# Patient Record
Sex: Female | Born: 1960 | Race: White | Hispanic: No | Smoking: Current some day smoker
Health system: Southern US, Community
[De-identification: ages and names within clinical notes are randomized; demographics above are authoritative.]

## PROBLEM LIST (undated history)

## (undated) DIAGNOSIS — K621 Rectal polyp: Secondary | ICD-10-CM

## (undated) DIAGNOSIS — Z87891 Personal history of nicotine dependence: Secondary | ICD-10-CM

## (undated) DIAGNOSIS — K5790 Diverticulosis of intestine, part unspecified, without perforation or abscess without bleeding: Secondary | ICD-10-CM

## (undated) DIAGNOSIS — M5412 Radiculopathy, cervical region: Secondary | ICD-10-CM

## (undated) DIAGNOSIS — K645 Perianal venous thrombosis: Secondary | ICD-10-CM

## (undated) DIAGNOSIS — Z8619 Personal history of other infectious and parasitic diseases: Secondary | ICD-10-CM

## (undated) DIAGNOSIS — O009 Unspecified ectopic pregnancy without intrauterine pregnancy: Secondary | ICD-10-CM

## (undated) DIAGNOSIS — I671 Cerebral aneurysm, nonruptured: Secondary | ICD-10-CM

## (undated) DIAGNOSIS — R911 Solitary pulmonary nodule: Secondary | ICD-10-CM

## (undated) DIAGNOSIS — Z9289 Personal history of other medical treatment: Secondary | ICD-10-CM

## (undated) HISTORY — DX: Radiculopathy, cervical region: M54.12

## (undated) HISTORY — PX: TUBAL LIGATION: SHX77

## (undated) HISTORY — DX: Diverticulosis of intestine, part unspecified, without perforation or abscess without bleeding: K57.90

## (undated) HISTORY — DX: Unspecified ectopic pregnancy without intrauterine pregnancy: O00.90

## (undated) HISTORY — DX: Cerebral aneurysm, nonruptured: I67.1

## (undated) HISTORY — DX: Rectal polyp: K62.1

## (undated) HISTORY — DX: Personal history of other infectious and parasitic diseases: Z86.19

## (undated) HISTORY — DX: Solitary pulmonary nodule: R91.1

## (undated) HISTORY — DX: Personal history of nicotine dependence: Z87.891

## (undated) HISTORY — PX: HUMERUS FRACTURE SURGERY: SHX670

## (undated) HISTORY — DX: Personal history of other medical treatment: Z92.89

## (undated) HISTORY — DX: Perianal venous thrombosis: K64.5

## (undated) HISTORY — PX: SALPINGOOPHORECTOMY: SHX82

---
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
## 1978-12-23 DIAGNOSIS — O009 Unspecified ectopic pregnancy without intrauterine pregnancy: Secondary | ICD-10-CM

## 1978-12-23 HISTORY — DX: Unspecified ectopic pregnancy without intrauterine pregnancy: O00.90

## 2010-04-23 DIAGNOSIS — I671 Cerebral aneurysm, nonruptured: Secondary | ICD-10-CM

## 2010-04-23 HISTORY — PX: ANEURYSM COILING: SHX5349

## 2010-04-23 HISTORY — DX: Cerebral aneurysm, nonruptured: I67.1

## 2010-06-08 ENCOUNTER — Ambulatory Visit (INDEPENDENT_AMBULATORY_CARE_PROVIDER_SITE_OTHER): Payer: 59 | Admitting: Internal Medicine

## 2010-06-08 ENCOUNTER — Encounter: Payer: Self-pay | Admitting: Internal Medicine

## 2010-06-08 DIAGNOSIS — K649 Unspecified hemorrhoids: Secondary | ICD-10-CM

## 2010-06-14 NOTE — Assessment & Plan Note (Signed)
Summary: HEMOROIDS/EVM   Vital Signs:  Patient Profile:   50 Years Old Female CC:      hemmorrhoid pain for 1 day Height:     66 inches Weight:      116 pounds Temp:     98.6 degrees F Pulse rate:   90 / minute Resp:     16 per minute BP sitting:   122 / 72  (left arm)  Pt. in pain?   yes    Intensity:   8    Type:       sharp                   Current Allergies: No known allergies History of Present Illness Chief Complaint: hemorrhoid pain for 1 day History of Present Illness: hemorrhoids for years but has never needed medical care. Usually responds to over the counter creams. This time the pain is worse and harder to reduce. No n/v/d. stools are brown and the only blood is when she's wiping after bm. 8/10 sharp pain hasn't been helped by advil.  REVIEW OF SYSTEMS Constitutional Symptoms      Denies fever, chills, night sweats, weight loss, weight gain, and fatigue.  Eyes       Denies change in vision, eye pain, eye discharge, glasses, contact lenses, and eye surgery. Ear/Nose/Throat/Mouth       Denies hearing loss/aids, change in hearing, ear pain, ear discharge, dizziness, frequent runny nose, frequent nose bleeds, sinus problems, sore throat, hoarseness, and tooth pain or bleeding.  Respiratory       Denies dry cough, productive cough, wheezing, shortness of breath, asthma, bronchitis, and emphysema/COPD.  Cardiovascular       Denies murmurs, chest pain, and tires easily with exhertion.    Gastrointestinal       Denies stomach pain, nausea/vomiting, diarrhea, constipation, blood in bowel movements, and indigestion.      Comments: see hpi Genitourniary       Denies painful urination, kidney stones, and loss of urinary control. Neurological       Denies paralysis, seizures, and fainting/blackouts. Musculoskeletal       Denies muscle pain, joint pain, joint stiffness, decreased range of motion, redness, swelling, muscle weakness, and gout.  Skin       Denies  bruising, unusual mles/lumps or sores, and hair/skin or nail changes.  Psych       Denies mood changes, temper/anger issues, anxiety/stress, speech problems, depression, and sleep problems.  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  Past Medical History: denies  Past Surgical History: Tubal ligation lost one ovary after ectopic traumatic repairs right arm  Family History: healthy  Social History: smokes 1 ppd, drinks 5 beers/week no drugs homemaker Physical Exam General appearance: thin, well developed, well nourished, no acute distress Eyes: conjunctivae and lids normal Neck: neck supple,  Abdomen: soft, non-tender without obvious organomegaly GU: 1x2 cm external hemorrhoid, sl firm, very tender that reduces manually Extremities: trauma scars right arm Neurological: grossly intact and non-focal Skin: no obvious rashes MSE: oriented to time, place, and person Assessment New Problems: HEMORRHOIDS (ICD-455.6)   Plan New Medications/Changes: HYDROCODONE-ACETAMINOPHEN 5-500 MG TABS (HYDROCODONE-ACETAMINOPHEN) 1 by mouth four times daily as needed pain  #15 x 0, 06/08/2010, J. Juline Patch MD ANUSOL-HC 25 MG SUPP (HYDROCORTISONE ACETATE) insert 1 per rectum two times a day or three times a day for 2-6  days  #10 x 1, 06/08/2010, J. Juline Patch MD   The patient and/or caregiver has been counseled thoroughly with regard to medications prescribed including dosage, schedule, interactions, rationale for use, and possible side effects and they verbalize understanding.  Diagnoses and expected course of recovery discussed and will return if not improved as expected or if the condition worsens. Patient and/or caregiver verbalized understanding.  Prescriptions: HYDROCODONE-ACETAMINOPHEN 5-500 MG TABS (HYDROCODONE-ACETAMINOPHEN) 1 by mouth four times daily as  needed pain  #15 x 0   Entered and Authorized by:   J. Juline Patch MD   Signed by:   Shela Commons. Juline Patch MD on 06/08/2010   Method used:   Print then Give to Patient   RxID:   [id number] ANUSOL-HC 25 MG SUPP (HYDROCORTISONE ACETATE) insert 1 per rectum two times a day or three times a day for 2-6 days  #10 x 1   Entered and Authorized by:   J. Juline Patch MD   Signed by:   Shela Commons. Juline Patch MD on 06/08/2010   Method used:   Electronically to        [facility]* (retail)       [address]       [place], [place]  [address]       Ph: [phone]       Fax: [phone]   RxID:   [id number]   Patient Instructions: 1)  sits baths. 2)  donut pillows. 3)  avoid straining your stomach muscles. 4)  go to emergency if you lose the ability to reduce the hemorrhoid. 5)  consider consulting general surgeon for permanent removal. 6)  Take 400-600mg  of Ibuprofen (Advil, Motrin) with food every 4-6 hours as needed for relief of pain or comfort of fever.

## 2010-06-15 ENCOUNTER — Ambulatory Visit (INDEPENDENT_AMBULATORY_CARE_PROVIDER_SITE_OTHER): Payer: 59 | Admitting: Family Medicine

## 2010-06-15 ENCOUNTER — Encounter: Payer: Self-pay | Admitting: Family Medicine

## 2010-06-15 DIAGNOSIS — Z87891 Personal history of nicotine dependence: Secondary | ICD-10-CM | POA: Insufficient documentation

## 2010-06-15 DIAGNOSIS — K645 Perianal venous thrombosis: Secondary | ICD-10-CM

## 2010-06-15 DIAGNOSIS — F17200 Nicotine dependence, unspecified, uncomplicated: Secondary | ICD-10-CM | POA: Insufficient documentation

## 2010-06-16 ENCOUNTER — Encounter (INDEPENDENT_AMBULATORY_CARE_PROVIDER_SITE_OTHER): Payer: Self-pay | Admitting: *Deleted

## 2010-06-16 ENCOUNTER — Other Ambulatory Visit: Payer: Self-pay | Admitting: Family Medicine

## 2010-06-16 ENCOUNTER — Other Ambulatory Visit (INDEPENDENT_AMBULATORY_CARE_PROVIDER_SITE_OTHER): Payer: 59

## 2010-06-16 DIAGNOSIS — Z0000 Encounter for general adult medical examination without abnormal findings: Secondary | ICD-10-CM

## 2010-06-16 LAB — LIPID PANEL
HDL: 50.5 mg/dL (ref >39.00)
LDL Cholesterol: 88 mg/dL (ref 0–99)
Total CHOL/HDL Ratio: 3
Triglycerides: 104 mg/dL (ref 0.0–149.0)
VLDL: 20.8 mg/dL (ref 0.0–40.0)

## 2010-06-16 LAB — CBC WITH DIFFERENTIAL/PLATELET
Basophils Relative: 0.6 % (ref 0.0–3.0)
Eosinophils Relative: 3.1 % (ref 0.0–5.0)
Lymphocytes Relative: 30.9 % (ref 12.0–46.0)
MCV: 95.4 fl (ref 78.0–100.0)
Monocytes Absolute: 0.4 10*3/uL (ref 0.1–1.0)
Neutrophils Relative %: 60.2 % (ref 43.0–77.0)
RBC: 3.99 Mil/uL (ref 3.87–5.11)
WBC: 8.3 10*3/uL (ref 4.5–10.5)

## 2010-06-16 LAB — BASIC METABOLIC PANEL
CO2: 30 mEq/L (ref 19–32)
Calcium: 9.3 mg/dL (ref 8.4–10.5)
Creatinine, Ser: 0.6 mg/dL (ref 0.4–1.2)

## 2010-06-16 LAB — HEPATIC FUNCTION PANEL
Bilirubin, Direct: 0.1 mg/dL (ref 0.0–0.3)
Total Protein: 6.5 g/dL (ref 6.0–8.3)

## 2010-06-20 NOTE — Assessment & Plan Note (Deleted)
Summary: Gina Powell PATIENT/[employer]/GAVE HUSBAND NPP /   Vital Signs:  Patient profile:   50 year old female Height:      66 inches Weight:      119.75 pounds BMI:     19.40 Temp:     97.7 degrees F oral Pulse rate:   76 / minute Pulse rhythm:   regular BP sitting:   136 / 78  (left arm) Cuff size:   regular  Vitals Entered By: Selena Batten Dance CMA Duncan Dull) (June 15, 2010 10:35 AM) CC: new pt establish   History of Present Illness: CC: new patient, establish  hemorrhoid issue - went last week to [facility], given suppository and pain med.  got worse, went over weekend to [place] and lanced.  Cream given.  Seen again yesterday at [facility], told looking better.  Today better controlled, pain bearable with 1/2 vicodin.  H/o hemorrhoids in past, never as bad as this.  Bowels pretty regular, 2-3 soft stools a day.    otherwise no concerns.  may have picked up cold from doctor's office.  preventative: last CPE 1980s, had pregnancy where had to be hospitalized (ruptured ectopic). never had doctor. last pap 1980s unsure last blood work. last tetanus shot - 10/2009 flu shot - 2 years ago.  -  Date:  10/21/2009    TD booster given  Current Medications (verified): 1)  Flax Seed Oil 1000 Mg Caps (Flaxseed (Linseed)) .Marland Kitchen.. 1 By Mouth Three Times A Day 2)  Garlic Oil 1000 Mg Caps (Garlic) .Marland Kitchen.. 1 By Mouth Once Daily 3)  Hydrocodone-Acetaminophen 5-500 Mg Tabs (Hydrocodone-Acetaminophen) .Marland Kitchen.. 1 By Mouth Every 4-6 Hours As Needed For Pain (Hemorrhoid Pain) 4)  Proctozone-Hc 2.5 % Crea (Hydrocortisone) .... Apply 2-3 Times Daily To Hemorrhoid 5)  Hydrocortisone Acetate 25 Mg Supp (Hydrocortisone Acetate) .... Insert 1 Rectally 2-3 Times Daily For Hemorrhoid  Allergies (verified): No Known Drug Allergies  Past History:  Past Medical History: hemorrhoid smoking h/o blood transfusion  ~1978 h/o chicken pox  Past Surgical History: T&A 1972 ruptured ectopic x2 1980s, 1995, s/p BTL and one ovary/tube  removed (unsure)  Family History: Father: unsure Mother: HLD, DM, HTN PGM: leukemia PGF: colon CA PAunt: bone/blood cancer MGF: CAD/MI MGM: CAD/MI MGAunt: Breast cancer  No cancers, CVA  Social History: 1/2 ppd, 1 beer/day, no rec drugs caffeine: decaf coffee Lives with husband Alden Server), outside cats Occupation: stay at home, 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         per HPI  Physical Exam  General:  Well-developed,well-nourished,in no acute distress; alert,appropriate and cooperative throughout examination.   Head:  Normocephalic and atraumatic without obvious abnormalities. No apparent alopecia or balding. Eyes:  No corneal or conjunctival inflammation noted. EOMI. Perrla.  Ears:  Tms clear bilaterally Nose:  nares clear bilaterally Mouth:  MMM, no pharyngeal erythema Neck:  No deformities, masses, or tenderness noted.  no LAD, no thyromegaly Lungs:  Normal respiratory effort, chest expands symmetrically. Lungs are clear to auscultation, no crackles or wheezes. Heart:  Normal rate and regular rhythm. S1 and S2 normal without gallop, murmur, click, rub or other extra sounds. Abdomen:  Bowel sounds positive,abdomen soft and non-tender without masses, organomegaly or hernias noted. Rectal:  large  ~3cm R swollen prolapsed external hemorrhoid.  Msk:  No deformity or scoliosis noted of thoracic or lumbar spine.   Pulses:  2+ rad pulses Extremities:  no pedal edema Neurologic:  CN grossly intact, station and gait intact Skin:  Intact without suspicious lesions or rashes Psych:  full affect, pleasant and cooperative   Impression &  Recommendations:  Problem # 1:  HEMORRHOID, THROMBOSED (ICD-455.7) thrombosed external hemorrhoid.  currently continues inflammed.  over weekend lanced, small amt clots expressed per pt at [facility].  started using hydrocortisone yesterday, swelling slowly resolving.  if continued, not resolved, may need referral to surg for definitive treatment/banding.  discussed diet to resolve hemorrhoids (more water, more fiber) and goal of 1 soft stool/day.  Problem # 2:  TOBACCO ABUSE (ICD-305.1) encouraged cessation.  Complete Medication List: 1)  Flax Seed Oil 1000 Mg Caps (Flaxseed (linseed)) .Marland Kitchen.. 1 by mouth three times a day 2)  Garlic Oil 1000 Mg Caps (Garlic) .Marland Kitchen.. 1 by mouth once daily 3)  Hydrocodone-acetaminophen 5-500 Mg Tabs (Hydrocodone-acetaminophen) .Marland Kitchen.. 1 by mouth every 4-6 hours as needed for pain (hemorrhoid pain) 4)  Proctozone-hc 2.5 % Crea (Hydrocortisone) .... Apply 2-3 times daily to hemorrhoid 5)  Hydrocortisone Acetate 25 Mg Supp (Hydrocortisone acetate) .... Insert 1 rectally 2-3 times daily for hemorrhoid  Patient Instructions: 1)  Return at your convenience in next few weeks for complete physical (set up with mammogram, breast exam, pap smear). 2)  return fasting for blood work in the next few weeks [FLP, CMP, TSH, CBC V70.0]. 3)  Good to meet you today. 4)  Continue current treatment for hemorrhoid.  If worsening or not improving as expected call me to set you up with surgeon.   Orders Added: 1)  New Patient Level III [[address]]    Current Allergies (reviewed today): No known allergies

## 2010-06-27 ENCOUNTER — Encounter (INDEPENDENT_AMBULATORY_CARE_PROVIDER_SITE_OTHER): Payer: Self-pay | Admitting: *Deleted

## 2010-06-27 ENCOUNTER — Encounter: Payer: Self-pay | Admitting: Family Medicine

## 2010-06-27 ENCOUNTER — Other Ambulatory Visit: Payer: Self-pay | Admitting: Family Medicine

## 2010-06-27 ENCOUNTER — Encounter (INDEPENDENT_AMBULATORY_CARE_PROVIDER_SITE_OTHER): Payer: 59 | Admitting: Family Medicine

## 2010-06-27 ENCOUNTER — Other Ambulatory Visit (HOSPITAL_COMMUNITY)
Admission: RE | Admit: 2010-06-27 | Discharge: 2010-06-27 | Disposition: A | Discharge status: Home / Self Care | Payer: 59 | Source: Ambulatory Visit | Attending: Family Medicine | Admitting: Family Medicine

## 2010-06-27 DIAGNOSIS — Z1159 Encounter for screening for other viral diseases: Secondary | ICD-10-CM | POA: Insufficient documentation

## 2010-06-27 DIAGNOSIS — E876 Hypokalemia: Secondary | ICD-10-CM

## 2010-06-27 DIAGNOSIS — K645 Perianal venous thrombosis: Secondary | ICD-10-CM

## 2010-06-27 DIAGNOSIS — Z0000 Encounter for general adult medical examination without abnormal findings: Secondary | ICD-10-CM

## 2010-06-27 DIAGNOSIS — Z1231 Encounter for screening mammogram for malignant neoplasm of breast: Secondary | ICD-10-CM

## 2010-06-27 DIAGNOSIS — Z124 Encounter for screening for malignant neoplasm of cervix: Secondary | ICD-10-CM | POA: Insufficient documentation

## 2010-06-27 DIAGNOSIS — F17200 Nicotine dependence, unspecified, uncomplicated: Secondary | ICD-10-CM

## 2010-06-27 LAB — CONVERTED CEMR LAB: Pap Smear: NORMAL

## 2010-07-03 ENCOUNTER — Encounter (INDEPENDENT_AMBULATORY_CARE_PROVIDER_SITE_OTHER): Payer: Self-pay | Admitting: *Deleted

## 2010-07-03 ENCOUNTER — Encounter: Payer: Self-pay | Admitting: *Deleted

## 2010-07-04 NOTE — Assessment & Plan Note (Deleted)
Summary: CPX SET UP MAMOGRAM BREAST EXAM PAP/[facility]   Vital Signs:  Patient profile:   50 year old female Weight:      121.50 pounds Temp:     98.3 degrees F oral Pulse rate:   76 / minute Pulse rhythm:   regular BP sitting:   116 / 70  (left arm) Cuff size:   regular  Vitals Entered By: Selena Batten Dance CMA Duncan Dull) (June 27, 2010 8:36 AM) CC: CPX   History of Present Illness: CC: CPE  hemorrhoid - not tender anymore but still present.  no bleeding.  normal stools.  no pain with stools.  still using hydrocortisone cream.  due for pap (last 1988, abnormal then went back to normal), mammo (never had). discussed colon cancer screening.  has never had.  requests colonoscopy.  smoking -  ~ 3/4 ppd .  states trying to quit.  wt stable around 110-120 lbs normally.  no loss.  no blood in stool.  Current Medications (verified): 1)  Flax Seed Oil 1000 Mg Caps (Flaxseed (Linseed)) .Marland Kitchen.. 1 By Mouth Three Times A Day 2)  Garlic Oil 1000 Mg Caps (Garlic) .Marland Kitchen.. 1 By Mouth Once Daily 3)  Proctozone-Hc 2.5 % Crea (Hydrocortisone) .... 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    Physical Exam  General:  Well-developed,well-nourished,in no acute distress; alert,appropriate and cooperative throughout examination.   Head:  Normocephalic and atraumatic without obvious abnormalities. No apparent alopecia or balding. Eyes:  No corneal or conjunctival inflammation noted. EOMI. Perrla.  Ears:  Tms clear bilaterally Nose:  nares clear bilaterally Mouth:  MMM, no pharyngeal erythema Neck:  No deformities, masses, or tenderness noted.  no LAD, no thyromegaly Breasts:  No mass, nodules, thickening, tenderness, bulging, retraction, inflamation, nipple discharge or skin changes noted.   Lungs:  Normal respiratory effort, chest expands symmetrically. Lungs are clear to auscultation, no crackles or wheezes. Heart:  Normal rate and regular rhythm. S1 and S2 normal without gallop, murmur, click, rub or other extra sounds. Abdomen:  Bowel sounds positive,abdomen soft and non-tender without masses, organomegaly or hernias noted. Rectal:  large  ~3cm R prolapsed hemorrhoid.  not reducible.  ?rectal prolapse Genitalia:  Pelvic Exam:        External: normal female genitalia without lesions or masses        Vagina: normal without lesions or masses        Cervix: normal without lesions or masses        Adnexa: normal bimanual exam without masses or fullness        Uterus: normal by palpation        Pap smear: performed Msk:  No deformity or scoliosis noted of thoracic or lumbar spine.   Pulses:  2+ rad pulses Extremities:  no pedal edema Neurologic:  CN grossly intact, station and gait intact Skin:  Intact without suspicious lesions or rashes Psych:  full affect, pleasant and cooperative   Impression & Recommendations:  Problem # 1:  HEALTH MAINTENANCE EXAM (ICD-V70.0) utd tetanus.   await surgery eval prior to setting up with colonsocopy but pt prefers colonoscopy over stool kit. set up  mammogram.  breast exam reassuring today. pap smear today.  pelvic reassuring. provided with copy of blood work.  Problem # 2:  HYPOKALEMIA (ICD-276.8)  recheck K  Orders: Venipuncture ([address]) TLB-Potassium (K+) (84132-K)  Problem # 3:  OTHER SCREENING MAMMOGRAM (ICD-V76.12) mammo ordered.   Orders: Radiology Referral (Radiology)  Problem # 4:  SCREENING FOR MALIGNANT NEOPLASM OF THE CERVIX (ICD-V76.2) pap today.  h/o abnormal, prior to 1988, none since.  rec rpt yearly x 3 years, then q3 years.  Orders: Pap Smear, Thin Prep ( Collection of) (U0454)  Problem # 5:  TOBACCO ABUSE (ICD-305.1)  encouraged cessation.  Problem # 6:  HEMORRHOID, THROMBOSED (ICD-455.7) referral to surgery for evaluation  will likely need removal.  ? mild rectal prolapse  Orders: Surgical Referral (Surgery)  Complete Medication List: 1)  Flax Seed Oil 1000 Mg Caps (Flaxseed (linseed)) .Marland Kitchen.. 1 by mouth three times a day 2)  Garlic Oil 1000 Mg Caps (Garlic) .Marland Kitchen.. 1 by mouth once daily 3)  Proctozone-hc 2.5 % Crea (Hydrocortisone) .... Apply 2-3 times daily to hemorrhoid  Patient Instructions: 1)  Return in 1 year or as needed, sooner if you want help with quitting smoking. 2)  pap smear today and breast exam today.  blood work provided. 3)  we will recheck potassium level today - increase amount of fruits and vegetables. 4)  pass by [facility] office for surgery referral as well as mammogram appointment. 5)  Good to see you today, call us with questions.   Orders Added: 1)  Venipuncture [36415] 2)  TLB-Potassium (K+) [84132-K] 3)  Est. Patient 40-64 years [99396] 4)  Pap Smear, Thin Prep ( Collection of) [Q0091] 5)  Surgical Referral [Surgery] 6)  Radiology Referral [Radiology]    Current Allergies (reviewed today): No known allergies

## 2010-07-07 ENCOUNTER — Encounter (HOSPITAL_COMMUNITY): Payer: Self-pay | Admitting: Radiology

## 2010-07-07 ENCOUNTER — Telehealth: Payer: Self-pay | Admitting: Family Medicine

## 2010-07-07 ENCOUNTER — Emergency Department (HOSPITAL_COMMUNITY): Payer: 59

## 2010-07-07 ENCOUNTER — Emergency Department (HOSPITAL_COMMUNITY)
Admission: EM | Admit: 2010-07-07 | Discharge: 2010-07-07 | Disposition: A | Discharge status: Nursing Home (Includes ICF) | Payer: 59 | Attending: Emergency Medicine | Admitting: Emergency Medicine

## 2010-07-07 DIAGNOSIS — I609 Nontraumatic subarachnoid hemorrhage, unspecified: Secondary | ICD-10-CM | POA: Insufficient documentation

## 2010-07-07 DIAGNOSIS — H53149 Visual discomfort, unspecified: Secondary | ICD-10-CM | POA: Insufficient documentation

## 2010-07-07 DIAGNOSIS — R112 Nausea with vomiting, unspecified: Secondary | ICD-10-CM | POA: Insufficient documentation

## 2010-07-07 DIAGNOSIS — R51 Headache: Secondary | ICD-10-CM | POA: Insufficient documentation

## 2010-07-07 DIAGNOSIS — M2569 Stiffness of other specified joint, not elsewhere classified: Secondary | ICD-10-CM | POA: Insufficient documentation

## 2010-07-07 LAB — DIFFERENTIAL
Basophils Absolute: 0 10*3/uL (ref 0.0–0.1)
Basophils Relative: 0 % (ref 0–1)
Eosinophils Absolute: 0.2 10*3/uL (ref 0.0–0.7)
Eosinophils Relative: 2 % (ref 0–5)
Lymphocytes Relative: 20 % (ref 12–46)
Monocytes Absolute: 0.7 10*3/uL (ref 0.1–1.0)

## 2010-07-07 LAB — BASIC METABOLIC PANEL
BUN: 8 mg/dL (ref 6–23)
CO2: 26 mEq/L (ref 19–32)
Chloride: 112 mEq/L (ref 96–112)
Creatinine, Ser: 0.61 mg/dL (ref 0.4–1.2)
Glucose, Bld: 112 mg/dL — ABNORMAL HIGH (ref 70–99)

## 2010-07-07 LAB — CBC
HCT: 40.4 % (ref 36.0–46.0)
MCHC: 34.7 g/dL (ref 30.0–36.0)
RDW: 14.3 % (ref 11.5–15.5)

## 2010-07-07 LAB — PROTIME-INR: INR: 1.06 (ref 0.00–1.49)

## 2010-07-09 NOTE — Consult Note (Signed)
  Gina Powell, Gina Powell              ACCOUNT NO.:  [id number]  MEDICAL RECORD NO.:  [id number]           PATIENT TYPE:  E  LOCATION:  [facility]                         FACILITY:  [facility]  PHYSICIAN:  Hilda Lias, M.D.   DATE OF BIRTH:  [birth year]  DATE OF CONSULTATION:  07/07/2010 DATE OF DISCHARGE:  07/07/2010                                CONSULTATION   Ms. Steptoe is a lady who was brought to Emergency Room by her husband complaining of sudden onset of headache.  The patient has never had a headache before.  Clinically, she had been doing really well until now. At the emergency room, she had a CT scan and we were called for evaluation.  PHYSICAL EXAMINATION:  VITAL SIGNS:  Blood pressure 106/60, pulse of 60, respiratory rate of 18.  She is afebrile. GENERAL:  She has no history of any allergy.  Mentally, she is oriented x3.  She is complaining mostly of headache and photophobia. HEAD, EARS, NOSE, AND THROAT:  Normal. NECK:  She has mild stiffness of the neck. LUNGS:  Clear. HEART:  Normal. ABDOMEN:  Normal. EXTREMITIES:  Normal pulse. NEURO:  Oriented x3.  Cranial nerves are normal.  It was difficult to do the funduscopy secondary to the patient having photophobia.  Face is symmetrical.  Sensation normal.  Reflexes symmetrical.  CT scan shows diffuse subarachnoid hemorrhage.  I talked to the patient and to the husband.  Immediately after that, I called Dr. Delia Chimes, [facility] who [facility] surgeon.  I explained the situation to him and the patient transferred.  He is going to made all the arrangement as I mentioned.  The patient will be transferred as soon as possible.  After I finished talking to Dr. Delia Chimes, I talked to the patient herself as well as the husband.  Today the patient is going to be transferred to [facility], they will do the angiogram and based on the result they will to either endovascular procedure or surgery.  The question from the hospital  were answered.          ______________________________ Hilda Lias, M.D.     EB/MEDQ  D:  07/07/2010  T:  07/08/2010  Job:  [id number]  Electronically Signed by Hilda Lias M.D. on 07/09/2010 12:13:15 AM

## 2010-07-11 NOTE — Miscellaneous (Deleted)
Summary: PAP to flowsheet  Clinical Lists Changes  Observations: Added new observation of PAP DUE: 06/2011 (07/03/2010 9:13) Added new observation of PAP SMEAR: normal (06/27/2010 9:14)      Preventive Care Screening  Pap Smear:    Date:  06/27/2010    Next Due:  06/2011    Results:  normal 

## 2010-07-11 NOTE — Letter (Deleted)
Summary: Results Follow up Letter  [place] [facility]  [address] [place], [place] [address]   Phone: [phone]  Fax: [phone]    07/03/2010 MRN: [id number]  [facility] [address] [place], [place]  [address]  [place]  Dear Ms. Schult,  The following are the results of your recent test(s):  Test         Result    Pap Smear:        Normal __X___  Not Normal _____ Comments: Repeat in 1 year for 2 more years then can wait 3 years. ______________________________________________________ Cholesterol: LDL(Bad cholesterol):         Your goal is less than:         HDL (Good cholesterol):       Your goal is more than: Comments:  ______________________________________________________ Mammogram:        Normal _____  Not Normal _____ Comments:  ___________________________________________________________________ Hemoccult:        Normal _____  Not normal _______ Comments:    _____________________________________________________________________ Other Tests:    We routinely do not discuss normal results over the telephone.  If you desire a copy of the results, or you have any questions about this information we can discuss them at your next office visit.   Sincerely,        Dr. Eustaquio Boyden

## 2010-07-12 ENCOUNTER — Ambulatory Visit: Payer: 59

## 2010-07-20 NOTE — Letter (Signed)
Summary: Results Follow up Letter  [name] [place]  [address]   [place], [place] [address]   Phone: [phone]  Fax: [phone]    07/03/2010 MRN: [id number]  Gina Powell [address] [place], [place]  [address]  [place]  Dear [name],  The following are the results of your recent test(s):  Test         Result    Pap Smear:        Normal __X___  Not Normal _____ Comments: Repeat in 1 year for 2 more years then can wait 3 years. ______________________________________________________ Cholesterol: LDL(Bad cholesterol):         Your goal is less than:         HDL (Good cholesterol):       Your goal is more than: Comments:  ______________________________________________________ Mammogram:        Normal _____  Not Normal _____ Comments:  ___________________________________________________________________ Hemoccult:        Normal _____  Not normal _______ Comments:    _____________________________________________________________________ Other Tests:    We routinely do not discuss normal results over the telephone.  If you desire a copy of the results, or you have any questions about this information we can discuss them at your next office visit.   Sincerely,        Dr. Javier Gutierrez 

## 2010-07-20 NOTE — Miscellaneous (Signed)
Summary: PAP to flowsheet  Clinical Lists Changes  Observations: Added new observation of PAP DUE: 06/2011 (07/03/2010 9:13) Added new observation of PAP SMEAR: normal (06/27/2010 9:14)      Preventive Care Screening  Pap Smear:    Date:  06/27/2010    Next Due:  06/2011    Results:  normal

## 2010-07-20 NOTE — Assessment & Plan Note (Signed)
Summary: /RBHNEW PATIENT/[facility]/GAVE HUSBAND NPP /   Vital Signs:  Patient profile:   49 year old female Height:      66 inches Weight:      119.75 pounds BMI:     19.40 Temp:     97.7 degrees F oral Pulse rate:   76 / minute Pulse rhythm:   regular BP sitting:   136 / 78  (left arm) Cuff size:   regular  Vitals Entered By: Kim Dance CMA (AAMA) (June 15, 2010 10:35 AM) CC: new pt establish   History of Present Illness: CC: new patient, establish  hemorrhoid issue - went last week to [facility], given suppository and pain med.  got worse, went over weekend to [facility] and lanced.  Cream given.  Seen again yesterday at [facility], told looking better.  Today better controlled, pain bearable with 1/2 vicodin.  H/o hemorrhoids in past, never as bad as this.  Bowels pretty regular, 2-3 soft stools a day.    otherwise no concerns.  may have picked up cold from doctor's office.  preventative: last CPE 1980s, had pregnancy where had to be hospitalized (ruptured ectopic). never had doctor. last pap 1980s unsure last blood work. last tetanus shot - 10/2009 flu shot - 2 years ago.  -  Date:  10/21/2009    TD booster given  Current Medications (verified): 1)  Flax Seed Oil 1000 Mg Caps (Flaxseed (Linseed)) .... 1 By Mouth Three Times A Day 2)  Garlic Oil 1000 Mg Caps (Garlic) .... 1 By Mouth Once Daily 3)  Hydrocodone-Acetaminophen 5-500 Mg Tabs (Hydrocodone-Acetaminophen) .... 1 By Mouth Every 4-6 Hours As Needed For Pain (Hemorrhoid Pain) 4)  Proctozone-Hc 2.5 % Crea (Hydrocortisone) .... Apply 2-3 Times Daily To Hemorrhoid 5)  Hydrocortisone Acetate 25 Mg Supp (Hydrocortisone Acetate) .... Insert 1 Rectally 2-3 Times Daily For Hemorrhoid  Allergies (verified): No Known Drug Allergies  Past History:  Past Medical History: hemorrhoid smoking h/o blood transfusion  ~1978 h/o chicken pox  Past Surgical History: T&A 1972 ruptured ectopic x2 1980s, 1995, s/p BTL and one ovary/tube  removed (unsure)  Family History: Father: unsure Mother: HLD, DM, HTN PGM: leukemia PGF: colon CA PAunt: bone/blood cancer MGF: CAD/MI MGM: CAD/MI MGAunt: Breast cancer  No cancers, CVA  Social History: 1/2 ppd, 1 beer/day, no rec drugs caffeine: decaf coffee Lives with husband (Ernest), outside cats Occupation: stay at home, 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         per HPI  Physical Exam  General:  Well-developed,well-nourished,in no acute distress; alert,appropriate and cooperative throughout examination.   Head:  Normocephalic and atraumatic without obvious abnormalities. No apparent alopecia or balding. Eyes:  No corneal or conjunctival inflammation noted. EOMI. Perrla.  Ears:  Tms clear bilaterally Nose:  nares clear bilaterally Mouth:  MMM, no pharyngeal erythema Neck:  No deformities, masses, or tenderness noted.  no LAD, no thyromegaly Lungs:  Normal respiratory effort, chest expands symmetrically. Lungs are clear to auscultation, no crackles or wheezes. Heart:  Normal rate and regular rhythm. S1 and S2 normal without gallop, murmur, click, rub or other extra sounds. Abdomen:  Bowel sounds positive,abdomen soft and non-tender without masses, organomegaly or hernias noted. Rectal:  large  ~3cm R swollen prolapsed external hemorrhoid.   Msk:  No deformity or scoliosis noted of thoracic or lumbar spine.   Pulses:  2+ rad pulses Extremities:  no pedal edema Neurologic:  CN grossly intact, station and gait intact Skin:  Intact without suspicious lesions or rashes Psych:  full affect, pleasant and cooperative   Impression &  Recommendations:  Problem # 1:  HEMORRHOID, THROMBOSED (ICD-455.7) thrombosed external hemorrhoid.  currently continues inflammed.  over weekend lanced, small amt clots expressed per pt at [facility].  started using hydrocortisone yesterday, swelling slowly resolving.  if continued, not resolved, may need referral to surg for definitive treatment/banding.  discussed diet to resolve hemorrhoids (more water, more fiber) and goal of 1 soft stool/day.  Problem # 2:  TOBACCO ABUSE (ICD-305.1) encouraged cessation.  Complete Medication List: 1)  Flax Seed Oil 1000 Mg Caps (Flaxseed (linseed)) .... 1 by mouth three times a day 2)  Garlic Oil 1000 Mg Caps (Garlic) .... 1 by mouth once daily 3)  Hydrocodone-acetaminophen 5-500 Mg Tabs (Hydrocodone-acetaminophen) .... 1 by mouth every 4-6 hours as needed for pain (hemorrhoid pain) 4)  Proctozone-hc 2.5 % Crea (Hydrocortisone) .... Apply 2-3 times daily to hemorrhoid 5)  Hydrocortisone Acetate 25 Mg Supp (Hydrocortisone acetate) .... Insert 1 rectally 2-3 times daily for hemorrhoid  Patient Instructions: 1)  Return at your convenience in next few weeks for complete physical (set up with mammogram, breast exam, pap smear). 2)  return fasting for blood work in the next few weeks [FLP, CMP, TSH, CBC V70.0]. 3)  Good to meet you today. 4)  Continue current treatment for hemorrhoid.  If worsening or not improving as expected call me to set you up with surgeon.   Orders Added: 1)  New Patient Level III [99203]    Current Allergies (reviewed today): No known allergies  

## 2010-07-20 NOTE — Assessment & Plan Note (Signed)
Summary: CPX SET UP MAMOGRAM BREAST EXAM PAP/[facility]   Vital Signs:  Patient profile:   50 year old female Weight:      121.50 pounds Temp:     98.3 degrees F oral Pulse rate:   76 / minute Pulse rhythm:   regular BP sitting:   116 / 70  (left arm) Cuff size:   regular  Vitals Entered By: Kim Dance CMA (AAMA) (June 27, 2010 8:36 AM) CC: CPX   History of Present Illness: CC: CPE  hemorrhoid - not tender anymore but still present.  no bleeding.  normal stools.  no pain with stools.  still using hydrocortisone cream.  due for pap (last 1988, abnormal then went back to normal), mammo (never had). discussed colon cancer screening.  has never had.  requests colonoscopy.  smoking -  ~ 3/4 ppd .  states trying to quit.  wt stable around 110-120 lbs normally.  no loss.  no blood in stool.  Current Medications (verified): 1)  Flax Seed Oil 1000 Mg Caps (Flaxseed (Linseed)) .... 1 By Mouth Three Times A Day 2)  Garlic Oil 1000 Mg Caps (Garlic) .... 1 By Mouth Once Daily 3)  Proctozone-Hc 2.5 % Crea (Hydrocortisone) .... 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    Physical Exam  General:  Well-developed,well-nourished,in no acute distress; alert,appropriate and cooperative throughout examination.   Head:  Normocephalic and atraumatic without obvious abnormalities. No apparent alopecia or balding. Eyes:  No corneal or conjunctival inflammation noted. EOMI. Perrla.  Ears:  Tms clear bilaterally Nose:  nares clear bilaterally Mouth:  MMM, no pharyngeal erythema Neck:  No deformities, masses, or tenderness noted.  no LAD, no thyromegaly Breasts:  No mass, nodules, thickening, tenderness, bulging, retraction, inflamation, nipple discharge or skin changes noted.   Lungs:  Normal respiratory effort, chest expands symmetrically. Lungs are clear to auscultation, no crackles or wheezes. Heart:  Normal rate and regular rhythm. S1 and S2 normal without gallop, murmur, click, rub or other extra sounds. Abdomen:  Bowel sounds positive,abdomen soft and non-tender without masses, organomegaly or hernias noted. Rectal:  large  ~3cm R prolapsed hemorrhoid.  not reducible.  ?rectal prolapse Genitalia:  Pelvic Exam:        External: normal female genitalia without lesions or masses        Vagina: normal without lesions or masses        Cervix: normal without lesions or masses        Adnexa: normal bimanual exam without masses or fullness        Uterus: normal by palpation        Pap smear: performed Msk:  No deformity or scoliosis noted of thoracic or lumbar spine.   Pulses:    2+ rad pulses Extremities:  no pedal edema Neurologic:  CN grossly intact, station and gait intact Skin:  Intact without suspicious lesions or rashes Psych:  full affect, pleasant and cooperative   Impression & Recommendations:  Problem # 1:  HEALTH MAINTENANCE EXAM (ICD-V70.0) utd tetanus.   await surgery eval prior to setting up with colonsocopy but pt prefers colonoscopy over stool kit. set up  mammogram.  breast exam reassuring today. pap smear today.  pelvic reassuring. provided with copy of blood work.  Problem # 2:  HYPOKALEMIA (ICD-276.8)  recheck K  Orders: Venipuncture (36415) TLB-Potassium (K+) (84132-K)  Problem # 3:  OTHER SCREENING MAMMOGRAM (ICD-V76.12) mammo ordered.   Orders: Radiology Referral (Radiology)  Problem # 4:  SCREENING FOR MALIGNANT NEOPLASM OF THE CERVIX (ICD-V76.2) pap today.  h/o abnormal, prior to 1988, none since.  rec rpt yearly x 3 years, then q3 years.  Orders: Pap Smear, Thin Prep ( Collection of) (Q0091)  Problem # 5:  TOBACCO ABUSE (ICD-305.1)  encouraged cessation.  Problem # 6:  HEMORRHOID, THROMBOSED (ICD-455.7) referral to surgery for evaluation  will likely need removal.  ? mild rectal prolapse  Orders: Surgical Referral (Surgery)  Complete Medication List: 1)  Flax Seed Oil 1000 Mg Caps (Flaxseed (linseed)) .... 1 by mouth three times a day 2)  Garlic Oil 1000 Mg Caps (Garlic) .... 1 by mouth once daily 3)  Proctozone-hc 2.5 % Crea (Hydrocortisone) .... Apply 2-3 times daily to hemorrhoid  Patient Instructions: 1)  Return in 1 year or as needed, sooner if you want help with quitting smoking. 2)  pap smear today and breast exam today.  blood work provided. 3)  we will recheck potassium level today - increase amount of fruits and vegetables. 4)  pass by [facility] for surgery referral as well as mammogram appointment. 5)  Good to see you today, call us with questions.   Orders Added: 1)  Venipuncture [36415] 2)  TLB-Potassium (K+) [84132-K] 3)  Est. Patient 40-64 years [99396] 4)  Pap Smear, Thin Prep ( Collection of) [Q0091] 5)  Surgical Referral [Surgery] 6)  Radiology Referral [Radiology]    Current Allergies (reviewed today): No known allergies  

## 2010-07-20 NOTE — Letter (Deleted)
Summary: Results Follow up Letter  [facility] [place]  [address]   [place], [place] [address]   Phone: [phone]  Fax: [phone]    07/03/2010 MRN: [id number]  Gina Powell [address] [place], [place]  [address]  [place]  Dear Ms. Weideman,  The following are the results of your recent test(s):  Test         Result    Pap Smear:        Normal __X___  Not Normal _____ Comments: Repeat in 1 year for 2 more years then can wait 3 years. ______________________________________________________ Cholesterol: LDL(Bad cholesterol):         Your goal is less than:         HDL (Good cholesterol):       Your goal is more than: Comments:  ______________________________________________________ Mammogram:        Normal _____  Not Normal _____ Comments:  ___________________________________________________________________ Hemoccult:        Normal _____  Not normal _______ Comments:    _____________________________________________________________________ Other Tests:    We routinely do not discuss normal results over the telephone.  If you desire a copy of the results, or you have any questions about this information we can discuss them at your next office visit.   Sincerely,        Dr. Javier Gutierrez 

## 2010-07-20 NOTE — Progress Notes (Signed)
Summary: aneurysm/seizure  Phone Note Call from Patient Call back at [phone]   Caller: Bill-husband Call For: Dr.Aveline Daus Summary of Call: Pt's husband called.  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  She's @ [facility] and is going in for brain surgery. Initial call taken by: Beau Fanny,  July 07, 2010 2:17 PM  Follow-up for Phone Call        called to touch base.  currently in surgery at [facility].  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  she is alert currently.  advised we would call next week to see how things went.  plz call Monday for fu. Follow-up by: Eustaquio Boyden  MD,  July 07, 2010 6:35 PM  Additional Follow-up for Phone Call Additional follow up Details #1::        Spoke with patient's husband. He said the surgery went very well and that the patient is finally able to eat today and keep things down. She is in the ICU currently with no word yet on when she will be transferred to step-down or discharged. She is alert and oriented and remembers the whole ordeal except for the actual seizure. She is currently having CT's every 12 hours to make sure things are okay. He said she is doing great considering the circumstances and appreciates [place] calling and checking on them.  Additional Follow-up by: Janee Morn CMA Duncan Dull),  July 10, 2010 11:29 AM    Additional Follow-up for Phone Call Additional follow up Details #2::    noted. thanks. Follow-up by: Eustaquio Boyden  MD,  July 10, 2010 1:29 PM

## 2010-07-24 ENCOUNTER — Telehealth: Payer: Self-pay | Admitting: *Deleted

## 2010-07-24 NOTE — Telephone Encounter (Signed)
Opened in error

## 2010-07-31 ENCOUNTER — Encounter: Payer: Self-pay | Admitting: Family Medicine

## 2010-08-01 ENCOUNTER — Encounter: Payer: Self-pay | Admitting: Family Medicine

## 2010-08-01 ENCOUNTER — Ambulatory Visit (INDEPENDENT_AMBULATORY_CARE_PROVIDER_SITE_OTHER): Payer: 59 | Admitting: Family Medicine

## 2010-08-01 VITALS — BP 110/80 | HR 76 | Temp 98.3°F | Wt 123.0 lb

## 2010-08-01 DIAGNOSIS — F17200 Nicotine dependence, unspecified, uncomplicated: Secondary | ICD-10-CM

## 2010-08-01 DIAGNOSIS — K645 Perianal venous thrombosis: Secondary | ICD-10-CM

## 2010-08-01 DIAGNOSIS — I671 Cerebral aneurysm, nonruptured: Secondary | ICD-10-CM | POA: Insufficient documentation

## 2010-08-01 NOTE — Assessment & Plan Note (Signed)
Continues prolapsed.  Awaiting surgery referral for eval.

## 2010-08-01 NOTE — Assessment & Plan Note (Signed)
Reviewed recent records from [facility].  To f/u with NSG in 4 1/2 wks.  Advised to ask duration of bid ASA therapy. 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

## 2010-08-01 NOTE — Assessment & Plan Note (Signed)
Quit smoking!  Congratulated and encouraged abstinence.

## 2010-08-01 NOTE — Patient Instructions (Signed)
Congratulations on quitting smoking!  Let us know if you think you need any help with the smoking. Good to see you today.  Call us with questions. Keep follow up appointment for next physical, return sooner if needed.

## 2010-08-01 NOTE — Progress Notes (Signed)
  Subjective:    Patient ID: Gina Powell, female    DOB: 01-Sep-1960, 50 y.o.   MRN: [id number]  HPI CC: f/u recent hospitalization  Presents with mother.  Reviewed [facility] records.  Hospitalization from 3/16-3/31/2012 for [facility] from ruptured basilar aneurysm s/p emergent coiling (Dr. Kelby Aline).  Seizure activity with acute initial HA, none since, not d/c with anti-epileptic drug.  Occasional headaches, states to be expected for next several weeks.  No vision changes.  Appetite ok.  To see NSG for f/u in 4 1/2 wks.  Finished Nimodipine course (21 days).  Only risk factor was smoking but states quit smoking!! since hospitalized.  Thinks will do well with this as hasn't really had cravings.  Hemorrhoid, decreasing in size, had to cancel surg f/u 2/2 recent events, will reschedule.  Also cancelled mammography, colonoscopy.  States with reschedule when things settle down.  Medications and allergies reviewed and updated as above. PMHx, SurgHx, SHx and FMHx reviewed for relevance and updated in chart.  Review of Systems Per HPI    Objective:   Physical Exam  Vitals reviewed. Constitutional: She is oriented to person, place, and time. She appears well-developed and well-nourished. No distress.  HENT:  Head: Normocephalic and atraumatic.  Mouth/Throat: Oropharynx is clear and moist. No oropharyngeal exudate.  Eyes: Conjunctivae and EOM are normal. Pupils are equal, round, and reactive to light. No scleral icterus.  Neck: Normal range of motion. Neck supple. No thyromegaly present.  Cardiovascular: Normal rate, regular rhythm, normal heart sounds and intact distal pulses.   No murmur heard. Pulmonary/Chest: Effort normal and breath sounds normal. No respiratory distress. She has no wheezes. She has no rales.  Genitourinary:       ~2.5cm R prolapsed hemorrhoid.  not reducible.  Lymphadenopathy:    She has no cervical adenopathy.  Neurological: She is alert and oriented to person, place, and  time. She has normal reflexes. No cranial nerve deficit. She exhibits normal muscle tone. Coordination normal.  Skin: Skin is warm and dry. No rash noted.  Psychiatric: She has a normal mood and affect.          Assessment & Plan:

## 2010-08-02 ENCOUNTER — Ambulatory Visit: Payer: 59 | Admitting: Family Medicine

## 2010-08-07 ENCOUNTER — Other Ambulatory Visit: Payer: Self-pay | Admitting: Family Medicine

## 2010-08-07 MED ORDER — BUTALBITAL-APAP-CAFFEINE 50-325-40 MG PO TABS: 1.0000 | ORAL_TABLET | ORAL | Status: DC | PRN

## 2010-08-07 NOTE — Progress Notes (Signed)
rec pt take tylenol, if not controlling HA, may take fioricet (per NSG expect to have HA for 6 mo after surgery).

## 2010-09-10 ENCOUNTER — Encounter: Payer: Self-pay | Admitting: Family Medicine

## 2010-10-26 ENCOUNTER — Encounter (HOSPITAL_COMMUNITY): Payer: Self-pay | Admitting: Radiology

## 2011-06-21 ENCOUNTER — Other Ambulatory Visit: Payer: Self-pay | Admitting: Family Medicine

## 2011-06-21 DIAGNOSIS — Z0000 Encounter for general adult medical examination without abnormal findings: Secondary | ICD-10-CM

## 2011-06-21 DIAGNOSIS — E876 Hypokalemia: Secondary | ICD-10-CM

## 2011-06-21 DIAGNOSIS — I671 Cerebral aneurysm, nonruptured: Secondary | ICD-10-CM

## 2011-06-25 ENCOUNTER — Other Ambulatory Visit (INDEPENDENT_AMBULATORY_CARE_PROVIDER_SITE_OTHER): Payer: 59

## 2011-06-25 DIAGNOSIS — I671 Cerebral aneurysm, nonruptured: Secondary | ICD-10-CM

## 2011-06-25 DIAGNOSIS — Z0000 Encounter for general adult medical examination without abnormal findings: Secondary | ICD-10-CM

## 2011-06-25 LAB — CBC WITH DIFFERENTIAL/PLATELET
Basophils Absolute: 0 10*3/uL (ref 0.0–0.1)
Basophils Relative: 0.6 % (ref 0.0–3.0)
Eosinophils Absolute: 0.2 10*3/uL (ref 0.0–0.7)
Hemoglobin: 12.6 g/dL (ref 12.0–15.0)
Lymphocytes Relative: 37.3 % (ref 12.0–46.0)
MCHC: 33.9 g/dL (ref 30.0–36.0)
MCV: 89.9 fl (ref 78.0–100.0)
Monocytes Absolute: 0.4 10*3/uL (ref 0.1–1.0)
Neutro Abs: 2.9 10*3/uL (ref 1.4–7.7)
RDW: 15.4 % — ABNORMAL HIGH (ref 11.5–14.6)

## 2011-06-25 LAB — COMPREHENSIVE METABOLIC PANEL
ALT: 23 U/L (ref 0–35)
AST: 21 U/L (ref 0–37)
CO2: 28 mEq/L (ref 19–32)
Calcium: 9.3 mg/dL (ref 8.4–10.5)
Chloride: 111 mEq/L (ref 96–112)
GFR: 91.81 mL/min (ref >60.00)
Sodium: 144 mEq/L (ref 135–145)
Total Bilirubin: 0.2 mg/dL — ABNORMAL LOW (ref 0.3–1.2)
Total Protein: 6.5 g/dL (ref 6.0–8.3)

## 2011-06-25 LAB — LIPID PANEL
Total CHOL/HDL Ratio: 3
VLDL: 17.6 mg/dL (ref 0.0–40.0)

## 2011-06-28 ENCOUNTER — Ambulatory Visit (INDEPENDENT_AMBULATORY_CARE_PROVIDER_SITE_OTHER)
Admission: RE | Admit: 2011-06-28 | Discharge: 2011-06-28 | Disposition: A | Discharge status: Home / Self Care | Payer: BC Managed Care – PPO | Source: Ambulatory Visit | Attending: Family Medicine | Admitting: Family Medicine

## 2011-06-28 ENCOUNTER — Other Ambulatory Visit (HOSPITAL_COMMUNITY)
Admission: RE | Admit: 2011-06-28 | Discharge: 2011-06-28 | Disposition: A | Discharge status: Home / Self Care | Payer: BC Managed Care – PPO | Source: Ambulatory Visit | Attending: Family Medicine | Admitting: Family Medicine

## 2011-06-28 ENCOUNTER — Ambulatory Visit (INDEPENDENT_AMBULATORY_CARE_PROVIDER_SITE_OTHER): Payer: Self-pay | Admitting: Family Medicine

## 2011-06-28 ENCOUNTER — Encounter: Payer: Self-pay | Admitting: Internal Medicine

## 2011-06-28 ENCOUNTER — Encounter: Payer: Self-pay | Admitting: Family Medicine

## 2011-06-28 DIAGNOSIS — R911 Solitary pulmonary nodule: Secondary | ICD-10-CM | POA: Insufficient documentation

## 2011-06-28 DIAGNOSIS — Z1211 Encounter for screening for malignant neoplasm of colon: Secondary | ICD-10-CM

## 2011-06-28 DIAGNOSIS — J438 Other emphysema: Secondary | ICD-10-CM

## 2011-06-28 DIAGNOSIS — Z87891 Personal history of nicotine dependence: Secondary | ICD-10-CM

## 2011-06-28 DIAGNOSIS — Z01419 Encounter for gynecological examination (general) (routine) without abnormal findings: Secondary | ICD-10-CM | POA: Insufficient documentation

## 2011-06-28 DIAGNOSIS — Z0000 Encounter for general adult medical examination without abnormal findings: Secondary | ICD-10-CM

## 2011-06-28 DIAGNOSIS — Z1159 Encounter for screening for other viral diseases: Secondary | ICD-10-CM | POA: Insufficient documentation

## 2011-06-28 DIAGNOSIS — Z1231 Encounter for screening mammogram for malignant neoplasm of breast: Secondary | ICD-10-CM

## 2011-06-28 MED ORDER — SERTRALINE HCL 25 MG PO TABS: 25.0000 mg | ORAL_TABLET | Freq: Every day | ORAL | Status: DC

## 2011-06-28 NOTE — Assessment & Plan Note (Addendum)
In ex smoker Rpt cxr today.

## 2011-06-28 NOTE — Patient Instructions (Signed)
Start zoloft for hot flashes.  Start at 25mg  daily for 1 week then may go up to 50 mg daily.  Don't stop suddenly, taper off if you want to stop.  Let me know how this medicine does. Pass by [facility] office to schedule mammogram and colonoscopy. Xray today to check on lungs. Good to see you today, call us with quesitons.

## 2011-06-28 NOTE — Assessment & Plan Note (Signed)
Preventative protocols reviewed and updated unless pt declined. Blood work reviewed in detail. refer for colonoscopy and mammogram.

## 2011-06-28 NOTE — Assessment & Plan Note (Signed)
Stays smoke free.

## 2011-06-28 NOTE — Progress Notes (Signed)
Addended by: Josph Macho A on: 06/28/2011 09:31 AM   Modules accepted: Orders

## 2011-06-28 NOTE — Progress Notes (Signed)
Subjective:    Patient ID: Gina Powell, female    DOB: 1960-12-05, 51 y.o.   MRN: [id number]  HPI CC: CPE  menopause around age 62yo.  Bad hot flashes.  Takes black cohosh which helps some but still really bothersome.  Occur every day, about 3-4/day.  No issues vaginal dryness.  Interested in trying SSRI for this.    No NS, fevers/chills.  Wt up 40 lbs.  Quit smoking, started eating. Smoking - quit 07/07/2011.  Wt Readings from Last 3 Encounters:  06/28/11 162 lb 4 oz (73.596 kg)  08/01/10 123 lb 0.6 oz (55.811 kg)  06/27/10 121 lb 8 oz (55.112 kg)   pulm nodule - ?6mm left lateral base, rec rpt CXR.  Will obtain today.  Preventative: Pap smear -due today.  Abnormal one remotely - normal 2012 Due for mammogram.  Never had last year, requests we set up. Colon cancer - requests colonoscopy. Tetanus - 10/2009 Flu shot last year. Seat belt 100% time  Caffeine: decaf coffee Lives with husband Alden Server), outside cats Occupation: Stay at [employer] service Edu: 11th grade Activity: planning on joining gym Diet: Good water, fruits/vegetables daily, avoids red meat  Medications and allergies reviewed and updated in chart.  Past histories reviewed and updated if relevant as below. Patient Active Problem List  Diagnoses  . HYPOKALEMIA  . TOBACCO ABUSE  . HEMORRHOID, THROMBOSED  . TOBACCO ABUSE  . HEMORRHOID, THROMBOSED  . Aneurysm, cerebral, nonruptured   Past Medical History  Diagnosis Date  . Unspecified thrombosed hemorrhoids   . History of tobacco abuse   . History of chicken pox   . History of transfusion of whole blood ~1978  . Ruptured ectopic pregnancy 1980's    x2  . 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 Emphysema     ?biapical emphysema found on CTA neck, consider rpt CXR  . Pulmonary nodule     ?6mm left lateral base, rec rpt CXR   Past  Surgical History  Procedure Date  . Tonsillectomy and adenoidectomy 1972  . Tubal ligation   . Salpingoophorectomy     one ovary and tube  . Aneurysm coiling 2012    ruptured basilar aneurysm  . Humerus fracture surgery     R plate in humerus   History  Substance Use Topics  . Smoking status: Former Smoker -- 0.5 packs/day    Quit date: 07/07/2010  . Smokeless tobacco: Never Used  . Alcohol Use: No     Quit-07-07-2010   Family History  Problem Relation Age of Onset  . Hyperlipidemia Mother   . Diabetes Mother   . Hypertension Mother   . Leukemia Paternal Grandmother   . Colon cancer Paternal Grandfather     90 y/o  . Bone cancer Paternal Aunt     bone and blood cancer  . Coronary artery disease Maternal Grandfather   . Coronary artery disease Maternal Grandmother   . Breast cancer      MGAunt  . Aneurysm Neg Hx    No Known Allergies Current Outpatient Prescriptions on File Prior to Visit  Medication Sig Dispense Refill  . Flaxseed, Linseed, (FLAX SEED OIL) 1000 MG CAPS Take 1 capsule by mouth 3 (three) times daily.        . Garlic Oil 1000 MG CAPS Take 1 capsule by mouth daily.        . hydrocortisone (  ANUSOL-HC) 2.5 % rectal cream Place 1 application rectally 2 (two) times daily.           Review of Systems  Constitutional: Negative for fever, chills, activity change, appetite change, fatigue and unexpected weight change.  HENT: Negative for hearing loss and neck pain.   Eyes: Negative for visual disturbance.  Respiratory: Negative for cough, chest tightness, shortness of breath and wheezing.   Cardiovascular: Negative for chest pain, palpitations and leg swelling.  Gastrointestinal: Negative for nausea, vomiting, abdominal pain, diarrhea, constipation, blood in stool and abdominal distention.  Genitourinary: Negative for hematuria and difficulty urinating.  Musculoskeletal: Negative for myalgias and arthralgias.  Skin: Negative for rash.  Neurological: Positive  for headaches. Negative for dizziness, seizures and syncope.  Hematological: Does not bruise/bleed easily.  Psychiatric/Behavioral: Negative for dysphoric mood. The patient is not nervous/anxious.        Objective:   Physical Exam  Nursing note and vitals reviewed. Constitutional: She is oriented to person, place, and time. She appears well-developed and well-nourished. No distress.  HENT:  Head: Normocephalic and atraumatic.  Right Ear: External ear normal.  Left Ear: External ear normal.  Nose: Nose normal.  Mouth/Throat: Oropharynx is clear and moist. No oropharyngeal exudate.  Eyes: Conjunctivae and EOM are normal. Pupils are equal, round, and reactive to light. No scleral icterus.  Neck: Normal range of motion. Neck supple. No thyromegaly present.  Cardiovascular: Normal rate, regular rhythm, normal heart sounds and intact distal pulses.   No murmur heard. Pulses:      Radial pulses are 2+ on the right side, and 2+ on the left side.  Pulmonary/Chest: Effort normal and breath sounds normal. No respiratory distress. She has no wheezes. She has no rales. Right breast exhibits no inverted nipple, no mass, no nipple discharge, no skin change and no tenderness. Left breast exhibits no inverted nipple, no mass, no nipple discharge, no skin change and no tenderness. Breasts are symmetrical.  Abdominal: Soft. Bowel sounds are normal. She exhibits no distension and no mass. There is no tenderness. There is no rebound and no guarding.  Genitourinary: Vagina normal and uterus normal. Rectal exam shows internal hemorrhoid. Pelvic exam was performed with patient supine. There is no rash, tenderness or lesion on the right labia. There is no rash, tenderness or lesion on the left labia. Cervix exhibits no motion tenderness, no discharge and no friability. Right adnexum displays no mass, no tenderness and no fullness. Left adnexum displays no mass, no tenderness and no fullness. No erythema or tenderness  around the vagina. No foreign body around the vagina. No signs of injury around the vagina. No vaginal discharge found.       Pap performed hemorrhoid present but not currently inflamed or bleeding  Musculoskeletal: Normal range of motion.  Lymphadenopathy:    She has no cervical adenopathy.    She has no axillary adenopathy.       Right axillary: No lateral adenopathy present.       Left axillary: No lateral adenopathy present.      Right: No supraclavicular adenopathy present.       Left: No supraclavicular adenopathy present.  Neurological: She is alert and oriented to person, place, and time.       CN grossly intact, station and gait intact  Skin: Skin is warm and dry. No rash noted.  Psychiatric: She has a normal mood and affect. Her behavior is normal. Judgment and thought content normal.      Assessment &  Plan:

## 2011-07-03 ENCOUNTER — Ambulatory Visit
Admission: RE | Admit: 2011-07-03 | Discharge: 2011-07-03 | Disposition: A | Discharge status: Home / Self Care | Payer: BC Managed Care – PPO | Source: Ambulatory Visit | Attending: Family Medicine | Admitting: Family Medicine

## 2011-07-03 DIAGNOSIS — Z1231 Encounter for screening mammogram for malignant neoplasm of breast: Secondary | ICD-10-CM

## 2011-07-04 ENCOUNTER — Other Ambulatory Visit: Payer: Self-pay | Admitting: Family Medicine

## 2011-07-04 DIAGNOSIS — R928 Other abnormal and inconclusive findings on diagnostic imaging of breast: Secondary | ICD-10-CM

## 2011-07-05 ENCOUNTER — Encounter: Payer: Self-pay | Admitting: *Deleted

## 2011-07-11 ENCOUNTER — Encounter: Payer: Self-pay | Admitting: *Deleted

## 2011-07-11 ENCOUNTER — Ambulatory Visit
Admission: RE | Admit: 2011-07-11 | Discharge: 2011-07-11 | Disposition: A | Discharge status: Home / Self Care | Payer: BC Managed Care – PPO | Source: Ambulatory Visit | Attending: Family Medicine | Admitting: Family Medicine

## 2011-07-11 DIAGNOSIS — R928 Other abnormal and inconclusive findings on diagnostic imaging of breast: Secondary | ICD-10-CM

## 2011-07-17 ENCOUNTER — Ambulatory Visit (AMBULATORY_SURGERY_CENTER): Payer: BC Managed Care – PPO

## 2011-07-17 ENCOUNTER — Encounter: Payer: Self-pay | Admitting: Internal Medicine

## 2011-07-17 VITALS — Ht 66.5 in | Wt 164.4 lb

## 2011-07-17 DIAGNOSIS — Z800 Family history of malignant neoplasm of digestive organs: Secondary | ICD-10-CM

## 2011-07-17 DIAGNOSIS — Z1211 Encounter for screening for malignant neoplasm of colon: Secondary | ICD-10-CM

## 2011-07-17 MED ORDER — PEG-KCL-NACL-NASULF-NA ASC-C 100 G PO SOLR: 1.0000 | Freq: Once | ORAL | Status: AC

## 2011-07-23 DIAGNOSIS — K621 Rectal polyp: Secondary | ICD-10-CM

## 2011-07-23 HISTORY — PX: COLONOSCOPY: SHX174

## 2011-07-23 HISTORY — DX: Rectal polyp: K62.1

## 2011-07-31 ENCOUNTER — Encounter: Payer: Self-pay | Admitting: Internal Medicine

## 2011-07-31 ENCOUNTER — Ambulatory Visit (AMBULATORY_SURGERY_CENTER): Payer: BC Managed Care – PPO | Admitting: Internal Medicine

## 2011-07-31 VITALS — BP 129/54 | HR 66 | Temp 97.7°F | Resp 18 | Ht 66.5 in | Wt 164.0 lb

## 2011-07-31 DIAGNOSIS — Z1211 Encounter for screening for malignant neoplasm of colon: Secondary | ICD-10-CM

## 2011-07-31 DIAGNOSIS — D126 Benign neoplasm of colon, unspecified: Secondary | ICD-10-CM

## 2011-07-31 DIAGNOSIS — Z800 Family history of malignant neoplasm of digestive organs: Secondary | ICD-10-CM

## 2011-07-31 MED ORDER — SODIUM CHLORIDE 0.9 % IV SOLN: 500.0000 mL | INTRAVENOUS | Status: DC

## 2011-07-31 NOTE — Op Note (Signed)
[facility] [address] [place], [place]  [address]  COLONOSCOPY PROCEDURE REPORT  PATIENT:  Gina Powell, Gina Powell  MR#:  [id number] BIRTHDATE:  May 10, 1960, 50 yrs. old  GENDER:  female ENDOSCOPIST:  Carie Caddy. Lyndia Bury, MD REF. BY:  Eustaquio Boyden, M.D. PROCEDURE DATE:  07/31/2011 PROCEDURE:  Colonoscopy with snare polypectomy ASA CLASS:  Class II INDICATIONS:  Routine Risk Screening MEDICATIONS:   MAC sedation, administered by CRNA, propofol (Diprivan) 300 mg IV  DESCRIPTION OF PROCEDURE:   After the risks benefits and alternatives of the procedure were thoroughly explained, informed consent was obtained.  Digital rectal exam was performed and revealed no rectal masses.   The LB 180AL [id number] endoscope was introduced through the anus and advanced to the terminal ileum which was intubated for a short distance, without limitations. The quality of the prep was good, using MoviPrep.  The instrument was then slowly withdrawn as the colon was fully examined. <<PROCEDUREIMAGES>>  FINDINGS:  The terminal ileum appeared normal.  Five sessile polyps measuring 3 - 5 mm were found in the rectum. Polyps were snared without cautery. Retrieval was successful. Scattered diverticulosis was found ascending colon to sigmoid colon. Medium-size internal hemorrhoids were found.   Retroflexed views in the rectum revealed no other findings other than those already described.  The scope was then withdrawn from the cecum and the procedure completed.  COMPLICATIONS:  None ENDOSCOPIC IMPRESSION: 1) Normal terminal ileum 2) Five polyps in the rectum. Removed and sent to pathology. 3) Diverticulosis, scattered ascending colon to sigmoid colon 4) Internal hemorrhoids  RECOMMENDATIONS: 1) Await pathology results 2) High fiber diet. 3) If the polyps removed today are proven to be adenomatous (pre-cancerous) polyps, you will need a colonoscopy in 3 years. Otherwise you should continue to follow  colorectal cancer screening guidelines for "routine risk" patients with a colonoscopy in 10 years. You will receive a letter within 1-2 weeks with the results of your biopsy as well as final recommendations. Please call my office if you have not received a letter after 3 weeks.  Carie Caddy. Rhea Belton, MD  CC:  Eustaquio Boyden MD The Patient  n. eSIGNED:   Carie Caddy. Donal Lynam at 07/31/2011 10:24 AM  Ralph Leyden, [id number]

## 2011-07-31 NOTE — Progress Notes (Signed)
Patient did not experience any of the following events: a burn prior to discharge; a fall within the facility; wrong site/side/patient/procedure/implant event; or a hospital transfer or hospital admission upon discharge from the facility. (G8907) Patient did not have preoperative order for IV antibiotic SSI prophylaxis. (G8918)  

## 2011-07-31 NOTE — Patient Instructions (Signed)

## 2011-08-01 ENCOUNTER — Telehealth: Payer: Self-pay | Admitting: *Deleted

## 2011-08-01 NOTE — Telephone Encounter (Signed)
  Follow up Call-  Call back number 07/31/2011  Post procedure Call Back phone  # [phone]  Permission to leave phone message Yes     Patient questions:  Do you have a fever, pain , or abdominal swelling? no Pain Score  0 *  Have you tolerated food without any problems? yes  Have you been able to return to your normal activities? yes  Do you have any questions about your discharge instructions: Diet   no Medications  no Follow up visit  no  Do you have questions or concerns about your Care? no  Actions: * If pain score is 4 or above: No action needed, pain <4.

## 2011-08-02 ENCOUNTER — Other Ambulatory Visit: Payer: Self-pay

## 2011-08-02 MED ORDER — SERTRALINE HCL 100 MG PO TABS: 50.0000 mg | ORAL_TABLET | Freq: Every day | ORAL | Status: DC

## 2011-08-02 NOTE — Telephone Encounter (Signed)
Spoke with patient and advised as instructed via telephone, she stated that she rather have the 100mg  tablets.  Advised her that Rx was sent to her pharmacy.

## 2011-08-02 NOTE — Telephone Encounter (Signed)
Pt said she will refill her Sertaline today and wants changed to 100 mg so pt can take 1/2 tab daily.Please advise. Pt last seen 06/28/11. Pt uses [facility]. Pt can be reached [phone].

## 2011-08-02 NOTE — Telephone Encounter (Signed)
Ok to do - sent in 100mg  dose, enough for 3 mo at a time.  plz notify pt and ensure she doesn't want 50mg  dose (so doesn't have to cut in 1/2.)

## 2011-08-07 ENCOUNTER — Encounter: Payer: Self-pay | Admitting: Internal Medicine

## 2011-08-08 ENCOUNTER — Encounter: Payer: Self-pay | Admitting: Family Medicine

## 2012-04-23 HISTORY — PX: ANTERIOR CERVICAL DECOMP/DISCECTOMY FUSION: SHX1161

## 2012-04-29 ENCOUNTER — Other Ambulatory Visit: Payer: Self-pay | Admitting: Family Medicine

## 2012-04-29 MED ORDER — ASPIRIN EC 325 MG PO TBEC: 325.0000 mg | DELAYED_RELEASE_TABLET | Freq: Every day | ORAL | Status: AC

## 2012-05-28 ENCOUNTER — Encounter: Payer: Self-pay | Admitting: Family Medicine

## 2012-06-21 DIAGNOSIS — M5412 Radiculopathy, cervical region: Secondary | ICD-10-CM

## 2012-06-21 HISTORY — DX: Radiculopathy, cervical region: M54.12

## 2012-07-29 ENCOUNTER — Encounter: Payer: Self-pay | Admitting: Family Medicine

## 2012-08-13 ENCOUNTER — Encounter: Payer: Self-pay | Admitting: Family Medicine

## 2012-09-03 ENCOUNTER — Other Ambulatory Visit: Payer: Self-pay | Admitting: Family Medicine

## 2012-09-10 ENCOUNTER — Encounter: Payer: Self-pay | Admitting: Family Medicine

## 2012-09-10 ENCOUNTER — Ambulatory Visit (INDEPENDENT_AMBULATORY_CARE_PROVIDER_SITE_OTHER): Payer: BC Managed Care – PPO | Admitting: Family Medicine

## 2012-09-10 VITALS — BP 128/88 | HR 72 | Temp 98.1°F | Wt 130.5 lb

## 2012-09-10 DIAGNOSIS — F4323 Adjustment disorder with mixed anxiety and depressed mood: Secondary | ICD-10-CM

## 2012-09-10 DIAGNOSIS — M7020 Olecranon bursitis, unspecified elbow: Secondary | ICD-10-CM

## 2012-09-10 DIAGNOSIS — M7022 Olecranon bursitis, left elbow: Secondary | ICD-10-CM | POA: Insufficient documentation

## 2012-09-10 DIAGNOSIS — M7021 Olecranon bursitis, right elbow: Secondary | ICD-10-CM

## 2012-09-10 MED ORDER — LORAZEPAM 0.5 MG PO TABS: 0.2500 mg | ORAL_TABLET | Freq: Two times a day (BID) | ORAL | Status: DC | PRN

## 2012-09-10 MED ORDER — CITALOPRAM HYDROBROMIDE 10 MG PO TABS: 10.0000 mg | ORAL_TABLET | Freq: Every day | ORAL | Status: DC

## 2012-09-10 MED ORDER — SERTRALINE HCL 100 MG PO TABS: 100.0000 mg | ORAL_TABLET | Freq: Every day | ORAL | Status: DC

## 2012-09-10 NOTE — Assessment & Plan Note (Signed)
After husband left her. Discussed support system at home. Denies SI/HI. Currently on zoloft - will increase to 100mg  daily - and add temporary benzo prn anxiety. Pt agrees with plan. Return in 1 mo for f/u.

## 2012-09-10 NOTE — Assessment & Plan Note (Signed)
Traumatic R olecranon bursitis. Discussed reasons to aspirate as well as complications, recurrence and possible need for surgery. Aspirated today and pt tolerated well. Discussed after care. To return if rpt accumulation of fluid, consider steroid injection.

## 2012-09-10 NOTE — Patient Instructions (Signed)
Increase zoloft to 100mg  daily. May use ativan as needed for anxiety. Olecranon bursa drained today - keep compression bandage on for 1-2 days. If fever, redness or warmth or worse pain, please return to see me sooner Otherwise follow up in 1 month .  Olecranon Bursitis Bursitis is swelling and soreness (inflammation) of a fluid-filled sac (bursa) that covers and protects a joint. Olecranon bursitis occurs over the elbow.  CAUSES Bursitis can be caused by injury, overuse of the joint, arthritis, or infection.  SYMPTOMS   Tenderness, swelling, warmth, or redness over the elbow.  Elbow pain with movement. This is greater with bending the elbow.  Squeaking sound when the bursa is rubbed or moved.  Increasing size of the bursa without pain or discomfort.  Fever with increasing pain and swelling if the bursa becomes infected. HOME CARE INSTRUCTIONS   Put ice on the affected area.  Put ice in a plastic bag.  Place a towel between your skin and the bag.  Leave the ice on for 15-20 minutes each hour while awake. Do this for the first 2 days.  When resting, elevate your elbow above the level of your heart. This helps reduce swelling.  Continue to put the joint through a full range of motion 4 times per day. Rest the injured joint at other times. When the pain lessens, begin normal slow movements and usual activities.  Only take over-the-counter or prescription medicines for pain, discomfort, or fever as directed by your caregiver.  Reduce your intake of milk and related dairy products (cheese, yogurt). They may make your condition worse. SEEK IMMEDIATE MEDICAL CARE IF:   Your pain increases even during treatment.  You have a fever.  You have heat and inflammation over the bursa and elbow.  You have a red line that goes up your arm.  You have pain with movement of your elbow. MAKE SURE YOU:   Understand these instructions.  Will watch your condition.  Will get help right  away if you are not doing well or get worse. Document Released: 05/09/2006 Document Revised: 07/02/2011 Document Reviewed: 03/25/2007 [facility] Patient Information 2014 [place], [place].

## 2012-09-10 NOTE — Progress Notes (Addendum)
  Subjective:    Patient ID: Gina Powell, female    DOB: Aug 11, 1960, 52 y.o.   MRN: [id number]  HPI CC: anxiety  03/2012 - husband walked out on patient.  Very stressed with this.  Appetite down, trouble sleeping, energy level down, depressed, anhedonia.  No SI/HI.  No excess anxiety.  Worried about finances.  Currently on zoloft 50mg  daily. Doesn't think could afford counselor.  Pt living in same place.  Husband living with another woman.    Just had cervical spine surgery at [facility] - recovering well.  Uses collar but currently doesn't have on.  Larey Seat about 1 month ago - tripped and fell on left elbow and knee.  Since then noticing increased swelling at olecranon bursa.  No redness or pain at that site.  No fevers/chills.  No smoking Occasional alcohol - max 2-3 beers/sitting No rec drugs.  Last used [facility] 6 mo ago.  Past Medical History  Diagnosis Date  . Unspecified thrombosed hemorrhoids   . History of tobacco abuse   . History of chicken pox   . History of transfusion of whole blood ~1978  . Ruptured ectopic pregnancy 1980's    x2  . 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 Kelby Aline)  . Emphysema     ?biapical emphysema found on CTA neck, rpt CXR 06/2011 hyperexpanded lungs  . Pulmonary nodule     6mm left lateral base, rpt CXR 06/2011 WNL  . Rectal polyp 07/2011    x4  . Diverticulosis   . Cervical radiculopathy 06/2012    MRI showing multilevel DDD, stenosis at C5/6, C6/7 with foraminal compromise and nerve root impingement (06/2012)     Review of Systems Per HPI     Objective:   Physical Exam  Nursing note and vitals reviewed. Constitutional: She appears well-developed and well-nourished.  Musculoskeletal:  FROM at elbows  L elbow: fluid collection at olecranon bursa. Able to fully flex/extend elbow.  No pain at lateral/medial epicondyle.  No warmth or surrounding  erythema around bursa.  Skin: Skin is warm and dry. No rash noted. No erythema.  Psychiatric: She exhibits a depressed mood.  Tearful throughout visit       Assessment & Plan:  IC obtained and in chart. L olecranon bursa identified - cleaned with betadine then alcohol wipe. Using ethyl chloride, 21g 1.5in needle inserted into bursa and 7cc of serosanguinous fluid aspirated. Wrapped afterwards with compression bandage. Pt tolerated procedure well.

## 2012-10-07 ENCOUNTER — Encounter: Payer: Self-pay | Admitting: Family Medicine

## 2012-10-07 ENCOUNTER — Telehealth: Payer: Self-pay | Admitting: Family Medicine

## 2012-10-07 MED ORDER — LORAZEPAM 1 MG PO TABS: 1.0000 mg | ORAL_TABLET | Freq: Two times a day (BID) | ORAL | Status: DC | PRN

## 2012-10-07 NOTE — Telephone Encounter (Signed)
[facility] Triage Call Report Triage Record Num: [id number] Operator: Chevis Pretty Patient Name: Gina Powell Call Date & Time: 10/07/2012 4:37:56PM Patient Phone: [phone] PCP: Eustaquio Boyden Patient Gender: Female PCP Fax : [phone] Patient DOB: 10-25-1960 [facility] Name[facility] Reason for Call: Caller: [name]/Patient; PCP: Eustaquio Boyden [facility]); CB#: [phone]; Call regarding Refill on ativan prescription; per Epic, last Rx filled 09/10/12 #30 of ativan 0.5 mg. States she needs to take 2 for anxiety for them to work. Declines new triage; states is not in danger/depressed. Info to office for provider review/Rx/callback. May reach patient at [phone]. Protocol(s) Used: Medication Questions - Adult Recommended Outcome per Protocol: Speak with Provider or Pharmacist within 24 hours Reason for Outcome: Older adult with questions about prescribed and/or nonprescribed medications not covered by available resources Care Advice: Safe Use of Medications: - Keep a list of your medications, listing both brand and generic names, the prescribed dosage, common side effects, recommended action for side effects, when to call their provider, and any other specific instructions. This medication list should be updated if any prescriptions change or if new medications are added. Your list should include nonprescription medications, vitamins and supplements. Ask about interaction with other medications, supplements or foods. - Take the medication list to each provider visit, especially if seeing more than one doctor. Make note of any known allergies on this list. - Use your medication exactly as directed. Any concerns about side effects should be discussed with your pharmacist or prescribing provider before changing doses or stopping the medication. - Don't chew or crush tablets or open capsules unless told to do so. Some long-acting medications can be  absorbed too quickly when chewed or crushed. Other medications either won't be effective or could cause side effects. - If you have difficulty swallowing pills, ask your provider or the pharmacist if the medication is available in liquid form. - For liquid medications, only use measuring device that came with it or was provided by the pharmacy. Household teaspoons and tablespoons can be inaccurate. - Never take anyone else's medication. ~ 10/07/2012 4:44:54PM Page 1 of 1 [id number]

## 2012-10-07 NOTE — Telephone Encounter (Signed)
plz phone in higher dose, but advise to use PRN and try to minimize use - this should be temporary med.

## 2012-10-08 ENCOUNTER — Telehealth: Payer: Self-pay

## 2012-10-08 NOTE — Telephone Encounter (Signed)
Rx called in as directed and message left notifying patient. 

## 2012-10-08 NOTE — Telephone Encounter (Signed)
this is a duplicate from yesterday's note.  Already addressed.

## 2012-10-08 NOTE — Telephone Encounter (Signed)
Triage Record Num: [id number] Operator: Chevis Pretty Patient Name: Gina Powell Call Date & Time: 10/07/2012 4:37:56PM Patient Phone: [phone] PCP: Eustaquio Boyden Patient Gender: Female PCP Fax : [phone] Patient DOB: Mar 21, 1961 [facility] Name[facility] Reason for Call: Caller: Gina Powell/Patient; PCP: Eustaquio Boyden [facility] [address]); CB#: [phone]; Call regarding Refill on ativan prescription; per Epic, last Rx filled 09/10/12 #30 of ativan 0.5 mg. States she needs to take 2 for anxiety for them to work. Declines new triage; states is not in danger/depressed. Info to office for provider review/Rx/callback. May reach patient at [phone]. Protocol(s) Used: Medication Questions - Adult Recommended Outcome per Protocol: Speak with Provider or Pharmacist within 24 hours Reason for Outcome: Older adult with questions about prescribed and/or nonprescribed medications not covered by available resources Care Advice: Safe Use of Medications: - Keep a list of your medications, listing both brand and generic names, the prescribed dosage, common side effects, recommended action for side effects, when to call their provider, and any other specific instructions. This medication list should be updated if any prescriptions change or if new medications are added. Your list should include nonprescription medications, vitamins and supplements. Ask about interaction with other medications, supplements or foods. - Take the medication list to each provider visit, especially if seeing more than one doctor. Make note of any known allergies on this list. - Use your medication exactly as directed. Any concerns about side effects should be discussed with your pharmacist or prescribing provider before changing doses or stopping the medication. - Don't chew or crush tablets or open capsules unless told to do so. Some long-acting medications can be absorbed too quickly when chewed or  crushed. Other medications either won't be effective or could cause side effects. - If you have difficulty swallowing pills, ask your provider or the pharmacist if the medication is available in liquid form. - For liquid medications, only use measuring device that came with it or was provided by the pharmacy. Household teaspoons and tablespoons can be inaccurate. - Never take anyone else's medication. ~ 06/

## 2012-10-10 ENCOUNTER — Encounter: Payer: Self-pay | Admitting: Radiology

## 2012-10-13 ENCOUNTER — Encounter: Payer: Self-pay | Admitting: Family Medicine

## 2012-10-13 ENCOUNTER — Ambulatory Visit (INDEPENDENT_AMBULATORY_CARE_PROVIDER_SITE_OTHER): Payer: BC Managed Care – PPO | Admitting: Family Medicine

## 2012-10-13 VITALS — BP 100/68 | HR 80 | Temp 98.0°F | Wt 129.2 lb

## 2012-10-13 DIAGNOSIS — M7020 Olecranon bursitis, unspecified elbow: Secondary | ICD-10-CM

## 2012-10-13 DIAGNOSIS — F4323 Adjustment disorder with mixed anxiety and depressed mood: Secondary | ICD-10-CM

## 2012-10-13 DIAGNOSIS — M7022 Olecranon bursitis, left elbow: Secondary | ICD-10-CM

## 2012-10-13 NOTE — Assessment & Plan Note (Signed)
Second aspiration performed today - dressed in pressure dressing. Traumatic L olecranon bursitis. If rpt accumulation, refer to ortho. Discussed after care. Did not inject steroid into bursa.  ADDENDUM ==> last visit erroneously documented R elbow as site - it was actually L elbow.

## 2012-10-13 NOTE — Progress Notes (Signed)
  Subjective:    Patient ID: Gina Powell, female    DOB: 1961/04/21, 52 y.o.   MRN: [id number]  HPI CC: 1 mo f/u  Presents with mother.  Olecranon bursitis - has reacummulated.  Kept pressure bandage on for 2 days, has returned.  Anxiety - last month we increase zoloft to 100mg  daily which may be helping some.  Also ativan was started which is also helping.  Noticed that needed increased dose to achieve anti anxiety effect, so currently taking 1mg  ativan PRN.  Stressed with upcoming divorce as well as recent father's passing away.  Past Medical History  Diagnosis Date  . Unspecified thrombosed hemorrhoids   . History of tobacco abuse   . History of chicken pox   . History of transfusion of whole blood ~1978  . Ruptured ectopic pregnancy 1980's    x2  . 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 Kelby Aline)  . Emphysema     ?biapical emphysema found on CTA neck, rpt CXR 06/2011 hyperexpanded lungs  . Pulmonary nodule     6mm left lateral base, rpt CXR 06/2011 WNL  . Rectal polyp 07/2011    x4  . Diverticulosis   . Cervical radiculopathy 06/2012    MRI showing multilevel DDD, stenosis at C5/6, C6/7 with foraminal compromise and nerve root impingement (06/2012) s/p ACDF    Past Surgical History  Procedure Laterality Date  . Tonsillectomy and adenoidectomy  1972  . Tubal ligation    . Salpingoophorectomy      one ovary and tube  . Aneurysm coiling  2012    ruptured basilar aneurysm  . Humerus fracture surgery      R plate in humerus  . Colonoscopy  07/2011    rectal polyps x5, int hemorrhoids, diverticulosis ([facility])  . Anterior cervical decomp/discectomy fusion  2014    C5-7 for radiculopathy    Review of Systems Per HPI    Objective:   Physical Exam  Nursing note and vitals reviewed. Constitutional: She appears well-developed and well-nourished. No distress.  HENT:   Mouth/Throat: Oropharynx is clear and moist. No oropharyngeal exudate.  Cardiovascular: Normal rate, regular rhythm, normal heart sounds and intact distal pulses.   No murmur heard. Pulmonary/Chest: Effort normal and breath sounds normal. No respiratory distress. She has no wheezes. She has no rales.  Musculoskeletal:  FROM at elbows  L elbow: fluid collection at olecranon bursa. Able to fully flex/extend elbow.  No pain at lateral/medial epicondyle.  No warmth or surrounding erythema around bursa.       Assessment & Plan:  IC obtained and in chart.  L olecranon bursa identified - cleaned with betadine then alcohol wipe.  Using ethyl chloride, 21g 1.5in needle inserted into bursa and 4cc of serosanguinous fluid aspirated.  Wrapped afterwards with compression bandage.  Pt tolerated procedure well.

## 2012-10-13 NOTE — Patient Instructions (Signed)
Good to see you today. Pass by lab to update controlled substance agreement. Call us with questions. If bursa collects fluid again, I will have you see orthopedist for further evaluation.

## 2012-10-13 NOTE — Assessment & Plan Note (Signed)
Adjustment disorder to recent stressors - mainly upcoming divorce. Doing better with increase in zoloft to 100mg  daily. Continue ativan at 1mg  bid prn anxiety for now. RTC PRN. Update controlled substance agreement today.

## 2012-10-23 ENCOUNTER — Telehealth: Payer: Self-pay | Admitting: Family Medicine

## 2012-10-23 NOTE — Telephone Encounter (Addendum)
Pt had abnormal drug screen. I'd like her to come in 1 mo from prior office visit to discuss.  (positive for restoril or valium, and xanax and MJ - pt endorsed xanax use) -> pt classified as high risk.

## 2012-10-27 NOTE — Telephone Encounter (Signed)
Left message on answering machine to call back.

## 2012-10-31 ENCOUNTER — Encounter: Payer: Self-pay | Admitting: Family Medicine

## 2012-11-04 NOTE — Telephone Encounter (Signed)
Left message on mail for patient to call and schedule 1 month follow up for next week.

## 2012-11-10 NOTE — Telephone Encounter (Signed)
Message left for patient to return my call and schedule appt.

## 2012-11-10 NOTE — Telephone Encounter (Signed)
Appt scheduled for the 1st of August. She couldn't afford co-pay until she got paid again.

## 2012-11-20 ENCOUNTER — Other Ambulatory Visit: Payer: Self-pay

## 2012-11-20 NOTE — Telephone Encounter (Signed)
Pt left note requesting refill lorazepam to [facility].Please advise.

## 2012-11-21 ENCOUNTER — Encounter: Payer: Self-pay | Admitting: Family Medicine

## 2012-11-21 ENCOUNTER — Ambulatory Visit (INDEPENDENT_AMBULATORY_CARE_PROVIDER_SITE_OTHER): Payer: BC Managed Care – PPO | Admitting: Family Medicine

## 2012-11-21 VITALS — BP 110/76 | HR 64 | Temp 98.1°F | Wt 127.0 lb

## 2012-11-21 DIAGNOSIS — F4323 Adjustment disorder with mixed anxiety and depressed mood: Secondary | ICD-10-CM

## 2012-11-21 DIAGNOSIS — R892 Abnormal level of other drugs, medicaments and biological substances in specimens from other organs, systems and tissues: Secondary | ICD-10-CM

## 2012-11-21 MED ORDER — LORAZEPAM 1 MG PO TABS: 1.0000 mg | ORAL_TABLET | Freq: Two times a day (BID) | ORAL | Status: DC | PRN

## 2012-11-21 NOTE — Telephone Encounter (Signed)
Pt needs to come in for appointment prior to refills (cancelled Aug 1st appt)

## 2012-11-21 NOTE — Telephone Encounter (Signed)
Pt called to ck on status of refill; advised pt as instructed and pt scheduled appt today at 11:15 am.

## 2012-11-21 NOTE — Patient Instructions (Signed)
Urine drug screen again today and monthly. Check up front if there is a copay for this. I've refilled lorazepam today. Only take as prescribed, only take meds prescribed for yourself. If any more abnormal results we will stop prescribing controlled meds.

## 2012-11-21 NOTE — Assessment & Plan Note (Signed)
UDS was positive for ativan, valium and xanax as well as MJ.  Pt only prescribed ativan. Discussed this, and discussed expectation to only take prescribed meds, and not use recreational drugs. Pt states took mother's xanax 2d ago because ran out of lorazepam. Advised if any more abnormal UDS I will no longer prescribe benzo's. Pt agrees with plan . Changed to high risk use- with monthly drug testing.

## 2012-11-21 NOTE — Assessment & Plan Note (Signed)
Increased stress recently.  Continue zoloft. Will continue lorazepam for now - discussed expectations of benzo use. See above. RTC PRN.

## 2012-11-21 NOTE — Progress Notes (Signed)
  Subjective:    Patient ID: Gina Powell, female    DOB: 1960/06/13, 52 y.o.   MRN: [id number]  HPI CC: f/u UDS  I asked patient to come in today to discuss abnormal UDS done for controlled substance agreement. (positive for restoril or valium, and xanax and MJ - pt endorsed xanax use) -> pt classified as high risk. Was only being treated with lorazepam  Ran out of lorazepam 1 week ago. Stated took friend's valium. States smoked weed with a friend.  Took mother's xanax on Wednesday.  Stressed with husband and court. Hopeful for resolution until December.  Past Medical History  Diagnosis Date  . Unspecified thrombosed hemorrhoids   . History of tobacco abuse   . History of chicken pox   . History of transfusion of whole blood ~1978  . Ruptured ectopic pregnancy 1980's    x2  . 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 Kelby Aline)  . Emphysema     ?biapical emphysema found on CTA neck, rpt CXR 06/2011 hyperexpanded lungs  . Pulmonary nodule     6mm left lateral base, rpt CXR 06/2011 WNL  . Rectal polyp 07/2011    x4  . Diverticulosis   . Cervical radiculopathy 06/2012    MRI showing multilevel DDD, stenosis at C5/6, C6/7 with foraminal compromise and nerve root impingement (06/2012) s/p ACDF     Review of Systems Per HPI    Objective:   Physical Exam Tearful with discussion of current stressors , specifically husband.    Assessment & Plan:

## 2012-11-21 NOTE — Telephone Encounter (Signed)
Message left advising patient. Still on schedule for today(?) Rena spoke with patient and advised her of your message and patient rescheduled appt for today.

## 2012-11-24 ENCOUNTER — Ambulatory Visit: Payer: BC Managed Care – PPO | Admitting: Family Medicine

## 2012-11-27 ENCOUNTER — Ambulatory Visit: Payer: BC Managed Care – PPO | Admitting: Family Medicine

## 2012-12-03 ENCOUNTER — Encounter: Payer: Self-pay | Admitting: Family Medicine

## 2012-12-18 ENCOUNTER — Encounter: Payer: Self-pay | Admitting: Radiology

## 2012-12-19 ENCOUNTER — Other Ambulatory Visit: Payer: Self-pay

## 2012-12-19 NOTE — Telephone Encounter (Signed)
Pt left vm requesting refill Ativan to [facility] pharmacy.Please advise.

## 2012-12-20 MED ORDER — LORAZEPAM 1 MG PO TABS: 1.0000 mg | ORAL_TABLET | Freq: Two times a day (BID) | ORAL | Status: AC | PRN

## 2012-12-20 NOTE — Telephone Encounter (Signed)
plz phone in , please have her come in next week for drug screen.

## 2012-12-23 ENCOUNTER — Other Ambulatory Visit: Payer: Self-pay | Admitting: Family Medicine

## 2012-12-23 NOTE — Telephone Encounter (Signed)
Rx called in as directed. Called patient to schedule UDS and she said she came in last week to have it done. Confirmed with Marcelle Smiling that it was done on 12/18/12. Awaiting results.

## 2013-01-01 ENCOUNTER — Encounter: Payer: Self-pay | Admitting: Family Medicine

## 2013-01-06 ENCOUNTER — Encounter: Payer: Self-pay | Admitting: Family Medicine

## 2013-01-06 ENCOUNTER — Ambulatory Visit (INDEPENDENT_AMBULATORY_CARE_PROVIDER_SITE_OTHER): Payer: BC Managed Care – PPO | Admitting: Family Medicine

## 2013-01-06 VITALS — BP 106/70 | HR 79 | Temp 98.1°F | Ht 66.0 in | Wt 124.0 lb

## 2013-01-06 DIAGNOSIS — Z23 Encounter for immunization: Secondary | ICD-10-CM

## 2013-01-06 DIAGNOSIS — M7712 Lateral epicondylitis, left elbow: Secondary | ICD-10-CM

## 2013-01-06 DIAGNOSIS — M7710 Lateral epicondylitis, unspecified elbow: Secondary | ICD-10-CM

## 2013-01-06 NOTE — Patient Instructions (Signed)
Flu shot today. You have tennis elbow. Treat with aleve 2 pills twice daily with food for 5 days then as needed. We have placed you in tennis elbow trap for left arm. Rest arm. Stretching exercises provided today. Let us know if not improving with above treatment.

## 2013-01-06 NOTE — Progress Notes (Signed)
  Subjective:    Patient ID: Gina Powell, female    DOB: 13-Jun-1960, 52 y.o.   MRN: [id number]  HPI CC: L elbow pain  H/o recurrent L olecranon bursitis.    Ongoing L elbow pain for the last month.  Now having L elbow pain laterally.  Pain with any heavy lifting.  This weekend increased yardwork - bending elbow repetitive hand motions. So far has tried 2 aleve which did help.  No fevers/chills, falls or inciting injury.  Past Medical History  Diagnosis Date  . Unspecified thrombosed hemorrhoids   . History of tobacco abuse   . History of chicken pox   . History of transfusion of whole blood ~1978  . Ruptured ectopic pregnancy 1980's    x2  . 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 Kelby Aline)  . Emphysema     ?biapical emphysema found on CTA neck, rpt CXR 06/2011 hyperexpanded lungs  . Pulmonary nodule     6mm left lateral base, rpt CXR 06/2011 WNL  . Rectal polyp 07/2011    x4  . Diverticulosis   . Cervical radiculopathy 06/2012    MRI showing multilevel DDD, stenosis at C5/6, C6/7 with foraminal compromise and nerve root impingement (06/2012) s/p ACDF     Review of Systems Per HPI    Objective:   Physical Exam  Nursing note and vitals reviewed. Constitutional: She appears well-developed and well-nourished. No distress.  Musculoskeletal:  R elbow WNL L elbow: Tender at lateral epicondyle and proximal to that.  Some swelling distally along forearm. Tender with resisted extension at wrist and resisted pronation. FROM at elbow in flexion/extension  Skin: Skin is warm and dry.       Assessment & Plan:

## 2013-01-06 NOTE — Addendum Note (Signed)
Addended by: Janalyn Harder on: 01/06/2013 10:04 AM   Modules accepted: Orders

## 2013-01-06 NOTE — Assessment & Plan Note (Signed)
See pt instructions for plan. Stretching exercises provided from [facility] pt advisor. Treat with OTC aleve, rest, and tennis elbow strap.

## 2013-01-16 IMAGING — CR DG CHEST 2V
2 series · 2 of 2 positions shown · non-contrast
Comparison: None.

CLINICAL DATA: Possible 6 mm nodule at the left lateral lung base

CHEST - 2 VIEW

[view not recorded (1 of 2)]
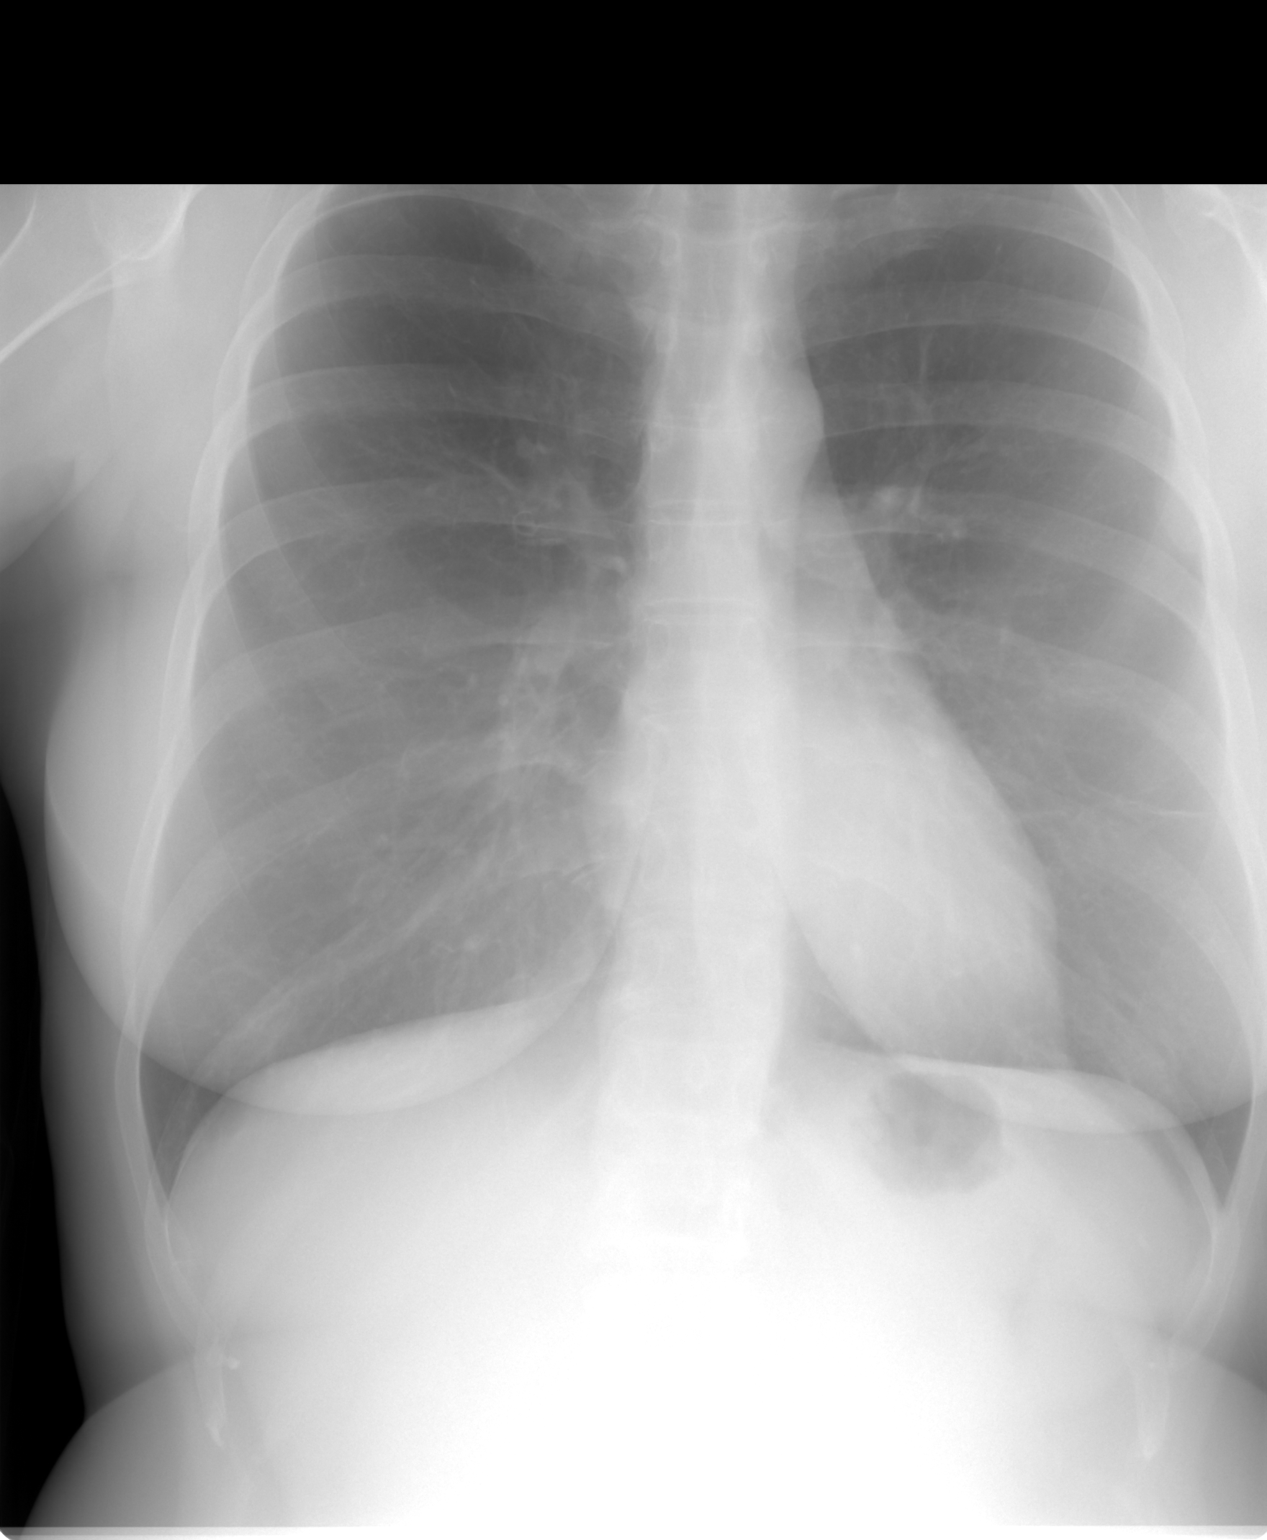

[view not recorded (2 of 2)]
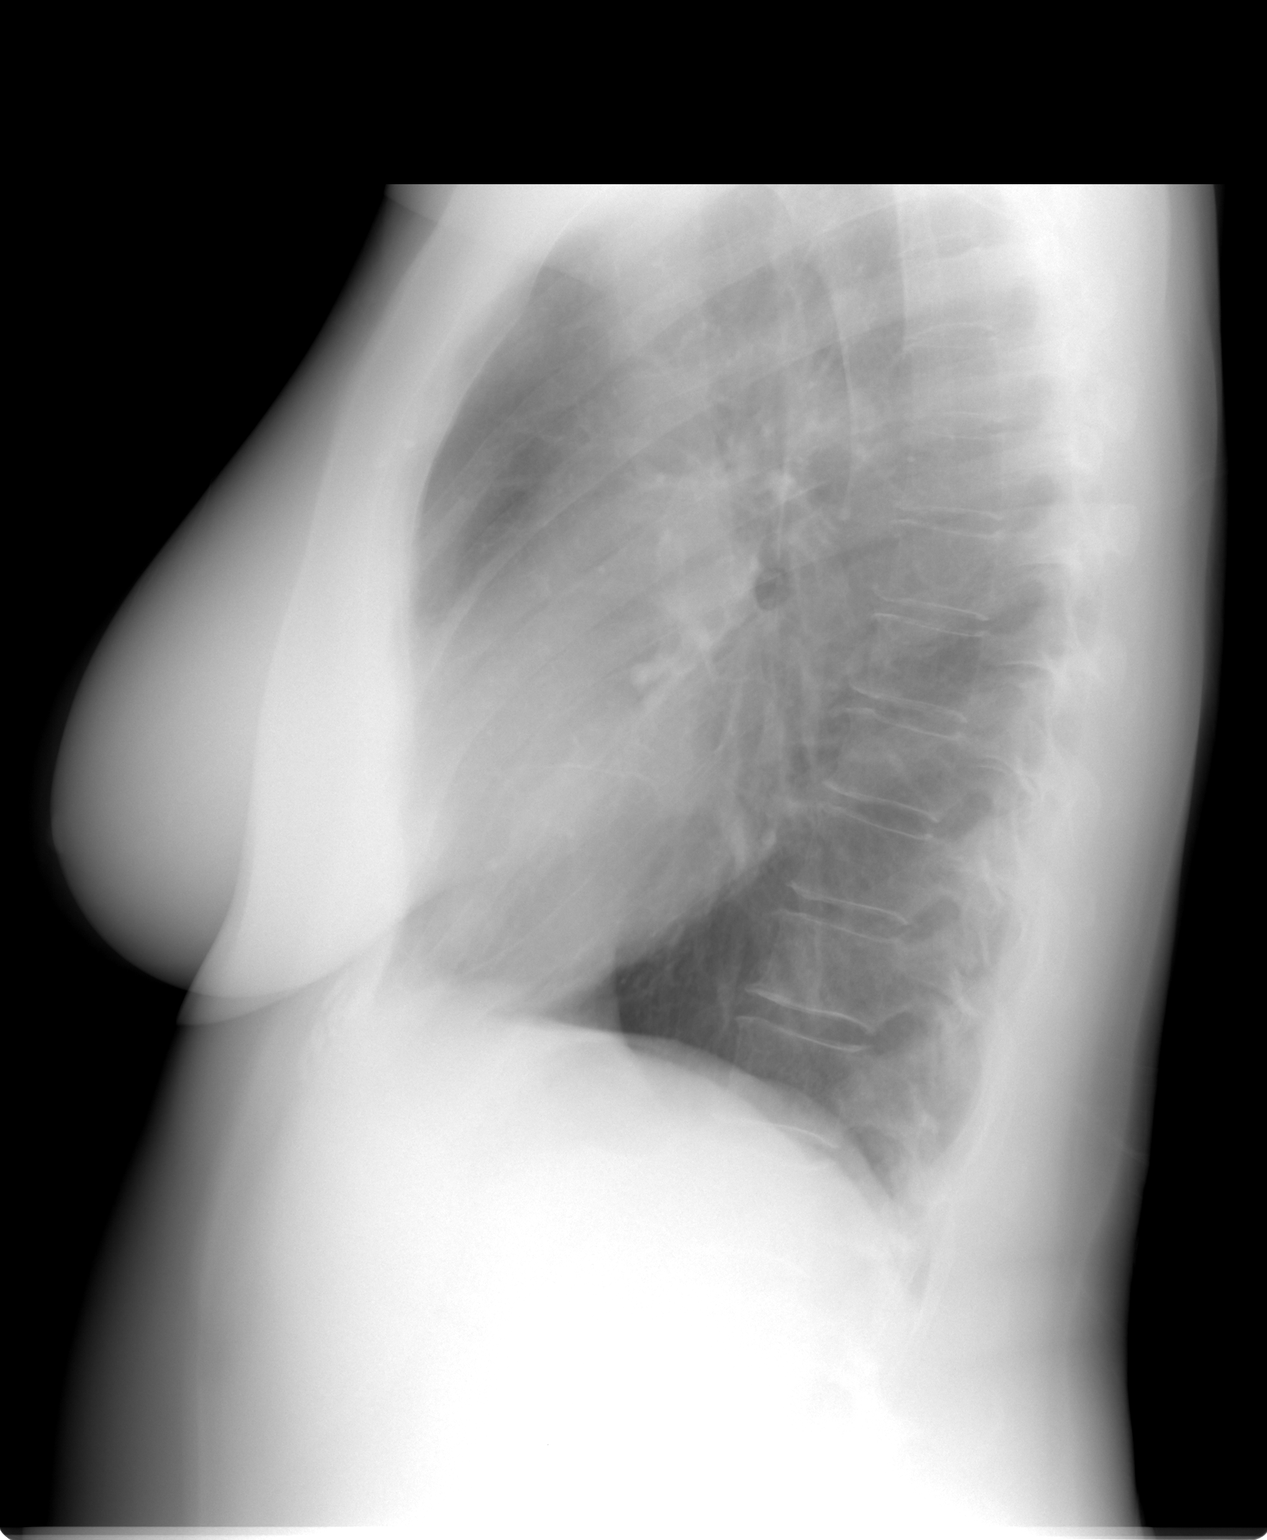

[2 of 2 positions shown; findings below may reference images not displayed]

FINDINGS: Lungs are essentially clear.  Suspected nipple shadows
overlying the lung bases.  No pleural effusion or pneumothorax.

Cardiomediastinal silhouette is within normal limits.

Mild degenerative changes of the visualized thoracolumbar spine.
IMPRESSION: No evidence of acute cardiopulmonary disease.

## 2013-05-27 ENCOUNTER — Emergency Department: Payer: Self-pay | Admitting: Internal Medicine

## 2013-09-25 ENCOUNTER — Other Ambulatory Visit: Payer: Self-pay | Admitting: Family Medicine

## 2014-02-13 ENCOUNTER — Emergency Department: Payer: Self-pay | Admitting: Emergency Medicine

## 2015-01-23 ENCOUNTER — Encounter: Payer: Self-pay | Admitting: Emergency Medicine

## 2015-01-23 ENCOUNTER — Emergency Department
Admission: EM | Admit: 2015-01-23 | Discharge: 2015-01-23 | Disposition: A | Discharge status: Home / Self Care | Payer: Self-pay | Attending: Emergency Medicine | Admitting: Emergency Medicine

## 2015-01-23 DIAGNOSIS — Z720 Tobacco use: Secondary | ICD-10-CM | POA: Insufficient documentation

## 2015-01-23 DIAGNOSIS — Z7982 Long term (current) use of aspirin: Secondary | ICD-10-CM | POA: Insufficient documentation

## 2015-01-23 DIAGNOSIS — L237 Allergic contact dermatitis due to plants, except food: Secondary | ICD-10-CM | POA: Insufficient documentation

## 2015-01-23 DIAGNOSIS — Z79899 Other long term (current) drug therapy: Secondary | ICD-10-CM | POA: Insufficient documentation

## 2015-01-23 MED ORDER — RANITIDINE HCL 150 MG PO TABS: 150.0000 mg | ORAL_TABLET | Freq: Two times a day (BID) | ORAL | Status: AC

## 2015-01-23 MED ORDER — HYDROXYZINE PAMOATE 25 MG PO CAPS: 25.0000 mg | ORAL_CAPSULE | Freq: Three times a day (TID) | ORAL | Status: AC | PRN

## 2015-01-23 MED ORDER — PREDNISONE 10 MG PO TABS: ORAL_TABLET | ORAL | Status: AC

## 2015-01-23 MED ORDER — HYDROXYZINE HCL 25 MG PO TABS
25.0000 mg | ORAL_TABLET | Freq: Once | ORAL | Status: AC
  Administered 2015-01-23: 25 mg via ORAL
  Filled 2015-01-23: qty 1

## 2015-01-23 MED ORDER — DEXAMETHASONE SODIUM PHOSPHATE 10 MG/ML IJ SOLN
10.0000 mg | Freq: Once | INTRAMUSCULAR | Status: AC
  Administered 2015-01-23: 10 mg via INTRAMUSCULAR
  Filled 2015-01-23: qty 1

## 2015-01-23 NOTE — Discharge Instructions (Signed)
Poison [employer] ivy is a inflammation of the skin (contact dermatitis) caused by touching the allergens on the leaves of the ivy plant following previous exposure to the plant. The rash usually appears 48 hours after exposure. The rash is usually bumps (papules) or blisters (vesicles) in a linear pattern. Depending on your own sensitivity, the rash may simply cause redness and itching, or it may also progress to blisters which may break open. These must be well cared for to prevent secondary bacterial (germ) infection, followed by scarring. Keep any open areas dry, clean, dressed, and covered with an antibacterial ointment if needed. The eyes may also get puffy. The puffiness is worst in the morning and gets better as the day progresses. This dermatitis usually heals without scarring, within 2 to 3 weeks without treatment. HOME CARE INSTRUCTIONS  Thoroughly wash with soap and water as soon as you have been exposed to poison ivy. You have about one half hour to remove the plant resin before it will cause the rash. This washing will destroy the oil or antigen on the skin that is causing, or will cause, the rash. Be sure to wash under your fingernails as any plant resin there will continue to spread the rash. Do not rub skin vigorously when washing affected area. Poison ivy cannot spread if no oil from the plant remains on your body. A rash that has progressed to weeping sores will not spread the rash unless you have not washed thoroughly. It is also important to wash any clothes you have been wearing as these may carry active allergens. The rash will return if you wear the unwashed clothing, even several days later. Avoidance of the plant in the future is the best measure. Poison ivy plant can be recognized by the number of leaves. Generally, poison ivy has three leaves with flowering branches on a single stem. Diphenhydramine may be purchased over the counter and used as needed for itching. Do not drive with  this medication if it makes you drowsy.Ask your caregiver about medication for children. SEEK MEDICAL CARE IF:  Open sores develop.  Redness spreads beyond area of rash.  You notice purulent (pus-like) discharge.  You have increased pain.  Other signs of infection develop (such as fever). Document Released: 04/06/2000 Document Revised: 07/02/2011 Document Reviewed: 09/17/2008 [facility] Patient Information 2015 [place], [place]. This information is not intended to replace advice given to you by your health care provider. Make sure you discuss any questions you have with your health care provider.  Poison [facility] is an inflammation of the skin (contact dermatitis). It is caused by contact with the allergens on the leaves of the oak (toxicodendron) plants. Depending on your sensitivity, the rash may consist simply of redness and itching, or it may also progress to blisters which may break open (rupture). These must be well cared for to prevent secondary germ (bacterial) infection as these infections can lead to scarring. The eyes may also get puffy. The puffiness is worst in the morning and gets better as the day progresses. Healing is best accomplished by keeping any open areas dry, clean, covered with a bandage, and covered with an antibacterial ointment if needed. Without secondary infection, this dermatitis usually heals without scarring within 2 to 3 weeks without treatment. HOME CARE INSTRUCTIONS When you have been exposed to poison oak, it is very important to thoroughly wash with soap and water as soon as the exposure has been discovered. You have about one half hour to  remove the plant resin before it will cause the rash. This cleaning will quickly destroy the oil or antigen on the skin (the antigen is what causes the rash). Wash aggressively under the fingernails as any plant resin still there will continue to spread the rash. Do not rub skin vigorously when washing affected area.  Poison oak cannot spread if no oil from the plant remains on your body. Rash that has progressed to weeping sores (lesions) will not spread the rash unless you have not washed thoroughly. It is also important to clean any clothes you have been wearing as they may carry active allergens which will spread the rash, even several days later. Avoidance of the plant in the future is the best measure. Poison oak plants can be recognized by the number of leaves. Generally, poison oak has three leaves with flowering branches on a single stem. Diphenhydramine may be purchased over the counter and used as needed for itching. Do not drive with this medication if it makes you drowsy. Ask your caregiver about medication for children. SEEK IMMEDIATE MEDICAL CARE IF:   Open areas of the rash develop.  You notice redness extending beyond the area of the rash.  There is a pus like discharge.  There is increased pain.  Other signs of infection develop (such as fever). Document Released: 10/14/2002 Document Revised: 07/02/2011 Document Reviewed: 02/23/2009 [facility] Patient Information 2015 [place], [place]. This information is not intended to replace advice given to you by your health care provider. Make sure you discuss any questions you have with your health care provider.

## 2015-01-23 NOTE — ED Notes (Signed)
Patient reports having itchy rash which appears to be poison ivy since Thursday and has continued to spread.  Rash on both legs, face, neck and arms.  Patient denies any fever/chills.

## 2015-01-23 NOTE — ED Notes (Signed)
Pt states Thursday she noted a small spot on her ankle, and rash has progressively gotten worse since then spreading up her legs, arms, and to her face. Pt states she has been putting ice and calamine lotion, taken benadryl, with no relief.

## 2015-01-23 NOTE — ED Provider Notes (Signed)
[facility] Emergency Department Provider Note  ____________________________________________  Time seen: Approximately 7:06 PM  I have reviewed the triage vital signs and the nursing notes.   HISTORY  Chief Complaint Poison Ivy    HPI Gina Powell is a 54 y.o. female who presents for evaluation of poison oak/poison ivy 4 days. Patient states it's all over her face arms legs. Complains of increased itching. No relief with calamine lotion or Benadryl.   Past Medical History  Diagnosis Date  . Unspecified thrombosed hemorrhoids   . History of tobacco abuse   . History of chicken pox   . History of transfusion of whole blood ~1978  . Ruptured ectopic pregnancy 1980's    x2  . 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 Harvel Ricks)  . Emphysema     ?biapical emphysema found on CTA neck, rpt CXR 06/2011 hyperexpanded lungs  . Pulmonary nodule     58mm left lateral base, rpt CXR 06/2011 WNL  . Rectal polyp 07/2011    x4  . Diverticulosis   . Cervical radiculopathy 06/2012    MRI showing multilevel DDD, stenosis at C5/6, C6/7 with foraminal compromise and nerve root impingement (06/2012) s/p ACDF    Patient Active Problem List   Diagnosis Date Noted  . Left tennis elbow 01/06/2013  . Abnormal drug screen 11/21/2012  . Olecranon bursitis of left elbow 09/10/2012  . Adjustment disorder with mixed anxiety and depressed mood 09/10/2012  . Pulmonary nodule, left 06/28/2011  . Healthcare maintenance 06/28/2011  . Emphysema   . Aneurysm, cerebral, nonruptured 08/01/2010  . History of smoking 06/15/2010  . HEMORRHOID, THROMBOSED 06/15/2010    Past Surgical History  Procedure Laterality Date  . Tonsillectomy and adenoidectomy  1972  . Tubal ligation    . Salpingoophorectomy      one ovary and tube  . Aneurysm coiling  2012    ruptured basilar aneurysm  . Humerus  fracture surgery      R plate in humerus  . Colonoscopy  07/2011    rectal polyps x5, int hemorrhoids, diverticulosis (Pyrtle)  . Anterior cervical decomp/discectomy fusion  2014    C5-7 for radiculopathy    Current Outpatient Rx  Name  Route  Sig  Dispense  Refill  . aspirin EC 325 MG tablet   Oral   Take 1 tablet (325 mg total) by mouth daily.   30 tablet   0   . hydrOXYzine (VISTARIL) 25 MG capsule   Oral   Take 1 capsule (25 mg total) by mouth 3 (three) times daily as needed.   30 capsule   0   . LORazepam (ATIVAN) 1 MG tablet   Oral   Take 1 tablet (1 mg total) by mouth 2 (two) times daily as needed for anxiety.   40 tablet   0   . predniSONE (DELTASONE) 10 MG tablet      Take 6 tablets daily x 2 days then decrease by 1 tablet every 2 days. 6-6-5-5-4-4-3-3-2-2-1-1-.5-.5   43 tablet   0   . ranitidine (ZANTAC) 150 MG tablet   Oral   Take 1 tablet (150 mg total) by mouth 2 (two) times daily.   14 tablet   1   . sertraline (ZOLOFT) 100 MG tablet      TAKE ONE (1) TABLET BY MOUTH EVERY DAY   30 tablet   3  Allergies Review of patient's allergies indicates no known allergies.  Family History  Problem Relation Age of Onset  . Hyperlipidemia Mother   . Diabetes Mother   . Hypertension Mother   . Leukemia Paternal Grandmother   . Colon cancer Paternal Grandfather     60 y/o  . Bone cancer Paternal Aunt     bone and blood cancer  . Coronary artery disease Maternal Grandfather   . Coronary artery disease Maternal Grandmother   . Breast cancer      MGAunt  . Aneurysm Neg Hx     Social History Social History  Substance Use Topics  . Smoking status: Current Some Day Smoker -- 0.50 packs/day    Types: Cigarettes    Last Attempt to Quit: 07/07/2010  . Smokeless tobacco: Never Used  . Alcohol Use: Yes     Comment: occasionally    Review of Systems Constitutional: No fever/chills Eyes: No visual changes. ENT: No sore throat. Cardiovascular:  Denies chest pain. Respiratory: Denies shortness of breath. Gastrointestinal: No abdominal pain.  No nausea, no vomiting.  No diarrhea.  No constipation. Genitourinary: Negative for dysuria. Musculoskeletal: Negative for back pain. Skin: Positive for rash. Neurological: Negative for headaches, focal weakness or numbness.  10-point ROS otherwise negative.  ____________________________________________   PHYSICAL EXAM:  VITAL SIGNS: ED Triage Vitals  Enc Vitals Group     BP 01/23/15 1810 121/79 mmHg     Pulse Rate 01/23/15 1810 85     Resp 01/23/15 1810 20     Temp 01/23/15 1810 98.4 F (36.9 C)     Temp Source 01/23/15 1810 Oral     SpO2 01/23/15 1810 97 %     Weight 01/23/15 1810 120 lb (54.432 kg)     Height 01/23/15 1810 5\' 6"  (1.676 m)     Head Cir --      Peak Flow --      Pain Score --      Pain Loc --      Pain Edu? --      Excl. in [place]? --     Constitutional: Alert and oriented. Well appearing and in no acute distress. Eyes: Conjunctivae are normal. PERRL. EOMI. Head: Atraumatic. Nose: No congestion/rhinnorhea. Mouth/Throat: Mucous membranes are moist.  Oropharynx non-erythematous. Neck: No stridor.   Cardiovascular: Normal rate, regular rhythm. Grossly normal heart sounds.  Good peripheral circulation. Respiratory: Normal respiratory effort.  No retractions. Lungs CTAB. Gastrointestinal: Soft and nontender. No distention. No abdominal bruits. No CVA tenderness. Musculoskeletal: No lower extremity tenderness nor edema.  No joint effusions. Neurologic:  Normal speech and language. No gross focal neurologic deficits are appreciated. No gait instability. Skin:  Skin is warm, dry and intact. Positive for vesicular rash on face and legs. Psychiatric: Mood and affect are normal. Speech and behavior are normal.  ____________________________________________   LABS (all labs ordered are listed, but only abnormal results are displayed)  Labs Reviewed - No data to  display    PROCEDURES  Procedure(s) performed: None  Critical Care performed: No  ____________________________________________   INITIAL IMPRESSION / ASSESSMENT AND PLAN / ED COURSE  Pertinent labs & imaging results that were available during my care of the patient were reviewed by me and considered in my medical decision making (see chart for details).  Acute contact dermatitis Rx given for prednisone tapering dose over 12 days, hydroxyzine 25 mg for itching and Zantac 150. Patient to return to the ER with any worsening symptomology.  Patient voices  no other emergency medical complaints at this time. ____________________________________________   FINAL CLINICAL IMPRESSION(S) / ED DIAGNOSES  Final diagnoses:  Poison [address], PA-C 01/23/15 1918  Lisa Roca, MD 01/24/15 1521

## 2016-04-01 ENCOUNTER — Emergency Department
Admission: EM | Admit: 2016-04-01 | Discharge: 2016-04-01 | Disposition: A | Discharge status: Home / Self Care | Payer: Self-pay | Attending: Emergency Medicine | Admitting: Emergency Medicine

## 2016-04-01 ENCOUNTER — Emergency Department: Payer: Self-pay

## 2016-04-01 DIAGNOSIS — Y999 Unspecified external cause status: Secondary | ICD-10-CM | POA: Insufficient documentation

## 2016-04-01 DIAGNOSIS — S0990XA Unspecified injury of head, initial encounter: Secondary | ICD-10-CM

## 2016-04-01 DIAGNOSIS — Y929 Unspecified place or not applicable: Secondary | ICD-10-CM | POA: Insufficient documentation

## 2016-04-01 DIAGNOSIS — F17210 Nicotine dependence, cigarettes, uncomplicated: Secondary | ICD-10-CM | POA: Insufficient documentation

## 2016-04-01 DIAGNOSIS — Z7982 Long term (current) use of aspirin: Secondary | ICD-10-CM | POA: Insufficient documentation

## 2016-04-01 DIAGNOSIS — W1800XA Striking against unspecified object with subsequent fall, initial encounter: Secondary | ICD-10-CM | POA: Insufficient documentation

## 2016-04-01 DIAGNOSIS — S0101XA Laceration without foreign body of scalp, initial encounter: Secondary | ICD-10-CM | POA: Insufficient documentation

## 2016-04-01 DIAGNOSIS — Y939 Activity, unspecified: Secondary | ICD-10-CM | POA: Insufficient documentation

## 2016-04-01 DIAGNOSIS — Z79899 Other long term (current) drug therapy: Secondary | ICD-10-CM | POA: Insufficient documentation

## 2016-04-01 NOTE — ED Triage Notes (Signed)
Pt says she was at show tonight in [place] when she fell; hit the back of her head on the floor; laceration to area; positive loss of consciousness; pt admits to drinking alcohol tonight

## 2016-04-01 NOTE — ED Provider Notes (Signed)
[facility] Emergency Department Provider Note   ____________________________________________    I have reviewed the triage vital signs and the nursing notes.   HISTORY  Chief Complaint Fall; Laceration; Loss of Consciousness; and Alcohol Intoxication     HPI Gina Powell is a 55 y.o. female who presents after a fall. Patient was intoxicated and lost her balance and fell backwards and struck the back of her head against a concrete floor. She denies LOC. She denies taking blood thinners. She does report a history of an aneurysm with coiling. No dizziness or nausea or vomiting.   Past Medical History:  Diagnosis Date  . 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 Harvel Ricks)  . Cervical radiculopathy 06/2012   MRI showing multilevel DDD, stenosis at C5/6, C6/7 with foraminal compromise and nerve root impingement (06/2012) s/p ACDF  . Diverticulosis   . Emphysema    ?biapical emphysema found on CTA neck, rpt CXR 06/2011 hyperexpanded lungs  . History of chicken pox   . History of tobacco abuse   . History of transfusion of whole blood ~1978  . Pulmonary nodule    53mm left lateral base, rpt CXR 06/2011 WNL  . Rectal polyp 07/2011   x4  . Ruptured ectopic pregnancy 1980's   x2  . Unspecified thrombosed hemorrhoids     Patient Active Problem List   Diagnosis Date Noted  . Left tennis elbow 01/06/2013  . Abnormal drug screen 11/21/2012  . Olecranon bursitis of left elbow 09/10/2012  . Adjustment disorder with mixed anxiety and depressed mood 09/10/2012  . Pulmonary nodule, left 06/28/2011  . Healthcare maintenance 06/28/2011  . Emphysema   . Aneurysm, cerebral, nonruptured 08/01/2010  . History of smoking 06/15/2010  . HEMORRHOID, THROMBOSED 06/15/2010    Past Surgical History:  Procedure Laterality Date  . ANEURYSM COILING  2012   ruptured basilar  aneurysm  . ANTERIOR CERVICAL DECOMP/DISCECTOMY FUSION  2014   C5-7 for radiculopathy  . COLONOSCOPY  07/2011   rectal polyps x5, int hemorrhoids, diverticulosis (Pyrtle)  . HUMERUS FRACTURE SURGERY     R plate in humerus  . SALPINGOOPHORECTOMY     one ovary and tube  . TONSILLECTOMY AND ADENOIDECTOMY  1972  . TUBAL LIGATION      Prior to Admission medications   Medication Sig Start Date End Date Taking? Authorizing Provider  aspirin EC 325 MG tablet Take 1 tablet (325 mg total) by mouth daily. 04/29/12   Ria Bush, MD  hydrOXYzine (VISTARIL) 25 MG capsule Take 1 capsule (25 mg total) by mouth 3 (three) times daily as needed. 01/23/15   Pierce Crane Beers, PA-C  LORazepam (ATIVAN) 1 MG tablet Take 1 tablet (1 mg total) by mouth 2 (two) times daily as needed for anxiety. 12/19/12   Ria Bush, MD  predniSONE (DELTASONE) 10 MG tablet Take 6 tablets daily x 2 days then decrease by 1 tablet every 2 days. 6-6-5-5-4-4-3-3-2-2-1-1-.5-.5 01/23/15   Pierce Crane Beers, PA-C  ranitidine (ZANTAC) 150 MG tablet Take 1 tablet (150 mg total) by mouth 2 (two) times daily. 01/23/15   Pierce Crane Beers, PA-C  sertraline (ZOLOFT) 100 MG tablet TAKE ONE (1) TABLET BY MOUTH EVERY DAY 09/25/13   Ria Bush, MD     Allergies Patient has no known allergies.  Family History  Problem Relation Age of Onset  . Hyperlipidemia Mother   . Diabetes Mother   .  Hypertension Mother   . Leukemia Paternal Grandmother   . Colon cancer Paternal Grandfather     1 y/o  . Bone cancer Paternal Aunt     bone and blood cancer  . Coronary artery disease Maternal Grandfather   . Coronary artery disease Maternal Grandmother   . Breast cancer      MGAunt  . Aneurysm Neg Hx     Social History Social History  Substance Use Topics  . Smoking status: Current Some Day Smoker    Packs/day: 0.50    Types: Cigarettes    Last attempt to quit: 07/07/2010  . Smokeless tobacco: Never Used  . Alcohol use Yes     Comment:  occasionally    Review of Systems  Constitutional: NoDizziness Eyes: No visual changes.   Cardiovascular: Denies chest pain. Respiratory: Denies shortness of breath. Gastrointestinal: No abdominal pain.  No nausea, no vomiting.    Musculoskeletal: Negative for back pain. Skin laceration to the scalp Neurological: Negative for headaches or weakness  10-point ROS otherwise negative.  ____________________________________________   PHYSICAL EXAM:  VITAL SIGNS: ED Triage Vitals  Enc Vitals Group     BP 04/01/16 0127 (!) 144/85     Pulse Rate 04/01/16 0127 70     Resp 04/01/16 0127 16     Temp 04/01/16 0127 98.1 F (36.7 C)     Temp Source 04/01/16 0127 Oral     SpO2 04/01/16 0127 100 %     Weight 04/01/16 0120 109 lb (49.4 kg)     Height 04/01/16 0120 5\' 6"  (1.676 m)     Head Circumference --      Peak Flow --      Pain Score 04/01/16 0120 9     Pain Loc --      Pain Edu? --      Excl. in [place]? --     Constitutional: Alert and oriented. No acute distress. Eyes: Conjunctivae are normal.  Head: 4 cm vertical laceration posterior scalp, bleeding controlled Nose: No congestion/rhinnorhea. Mouth/Throat: Mucous membranes are moist.   Neck:  No vertebral tenderness to palpation Cardiovascular: Normal rate, regular rhythm. Grossly normal heart sounds.  Good peripheral circulation. Respiratory: Normal respiratory effort.  No retractions. Lungs CTAB. Gastrointestinal: Soft and nontender. No distention.  No CVA tenderness. Genitourinary: deferred Musculoskeletal:  Warm and well perfused Neurologic:  Normal speech and language. No gross focal neurologic deficits are appreciated.  Skin:  Skin is warm, dry and intact. No rash noted. Psychiatric: Mood and affect are normal. Speech and behavior are normal.  ____________________________________________   LABS (all labs ordered are listed, but only abnormal results are displayed)  Labs Reviewed - No data to  display ____________________________________________  EKG  None ____________________________________________  RADIOLOGY  CT head unremarkable ____________________________________________   PROCEDURES  Procedure(s) performed: yes  LACERATION REPAIR Performed by: Lavonia Drafts Authorized by: Lavonia Drafts Consent: Verbal consent obtained. Risks and benefits: risks, benefits and alternatives were discussed Consent given by: patient Patient identity confirmed: provided demographic data Prepped and Draped in normal sterile fashion Wound explored  Laceration Location: posterior scalp  Laceration Length: 6 cm  No Foreign Bodies seen or palpated    Irrigation method: syringe Amount of cleaning: standard  Skin closure: staples  Number of staples: 5    Patient tolerance: Patient tolerated the procedure well with no immediate complications.     Critical Care performed: No ____________________________________________   INITIAL IMPRESSION / ASSESSMENT AND PLAN / ED COURSE  Pertinent labs &  imaging results that were available during my care of the patient were reviewed by me and considered in my medical decision making (see chart for details).  Patient resides after a fall with laceration to the posterior scalp. We will image the head and C-spine and repair the laceration  Clinical Course   Tetanus up-to-date  CT scans unremarkable.  Patient is intoxicated but has family here to take her home. Laceration repaired. Staple removal in 5 days ____________________________________________   FINAL CLINICAL IMPRESSION(S) / ED DIAGNOSES  Final diagnoses:  Injury of head, initial encounter  Scalp laceration, initial encounter      NEW MEDICATIONS STARTED DURING THIS VISIT:  New Prescriptions   No medications on file     Note:  This document was prepared using Dragon voice recognition software and may include unintentional dictation errors.    Lavonia Drafts, MD 04/01/16 [phone]

## 2016-04-01 NOTE — ED Notes (Signed)
Patient transported to CT 

## 2017-10-21 IMAGING — CT CT CERVICAL SPINE W/O CM
4 of 7 series · 14 of 33 positions shown, 15 images · non-contrast
Comparison: Head CT 07/07/2010

CLINICAL DATA: Fall striking back of head with laceration. Loss of
consciousness.

EXAM:
CT HEAD WITHOUT CONTRAST
CT CERVICAL SPINE WITHOUT CONTRAST
TECHNIQUE: Multidetector CT imaging of the head and cervical spine was
performed following the standard protocol without intravenous
contrast. Multiplanar CT image reconstructions of the cervical spine
were also generated.

[Series 7: c spine soft · axial · 0.32mm/px · z∈[-292,-190]mm · 4 of 85 slices shown]
[im 17/85  soft-tissue]
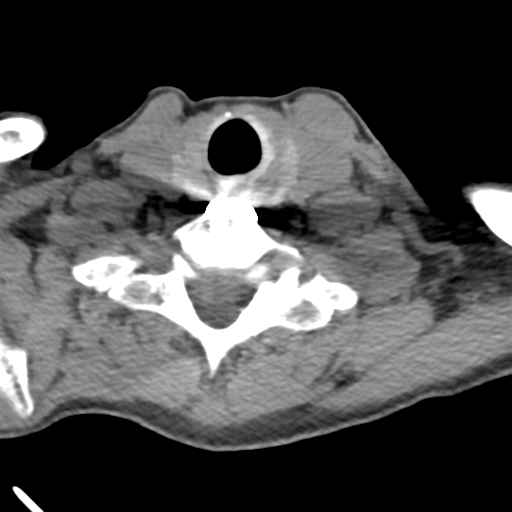
[im 34/85  soft-tissue]
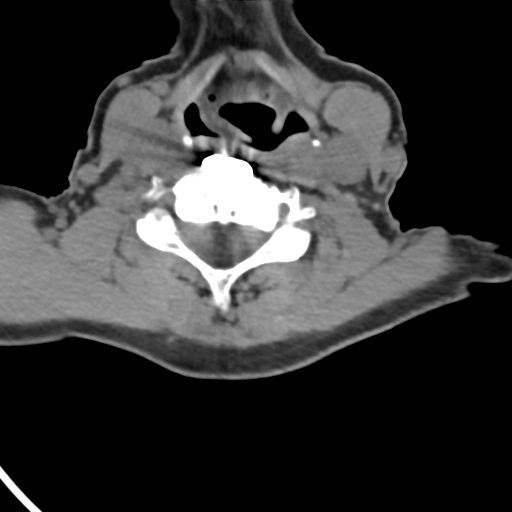
[im 51/85  soft-tissue]
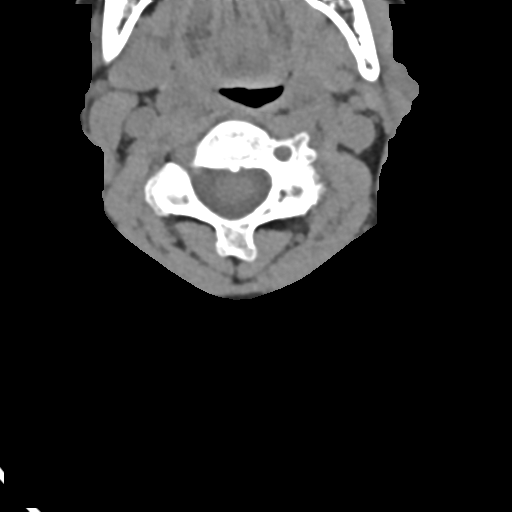
[im 68/85  soft-tissue]
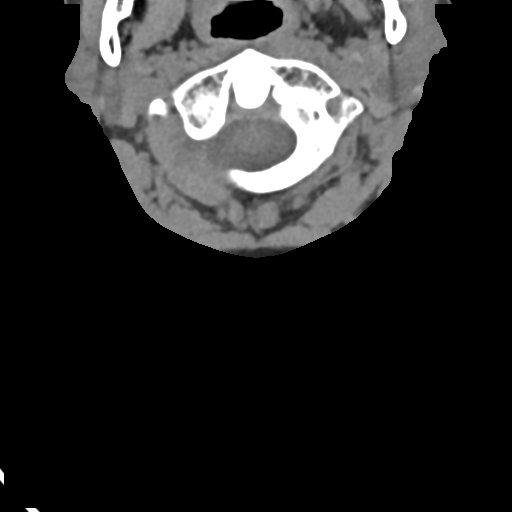

[Series 10: sagittal bone · sagittal · 0.27mm/px · 5 of 56 slices shown]
[im 10/56  bone]
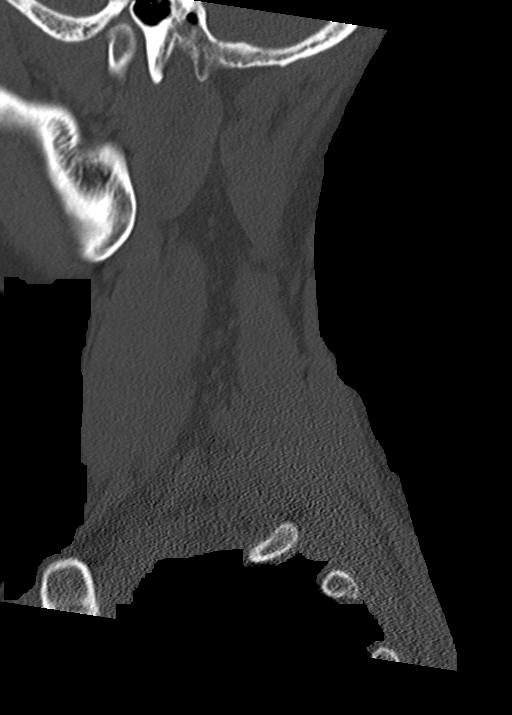
[im 19/56  bone]
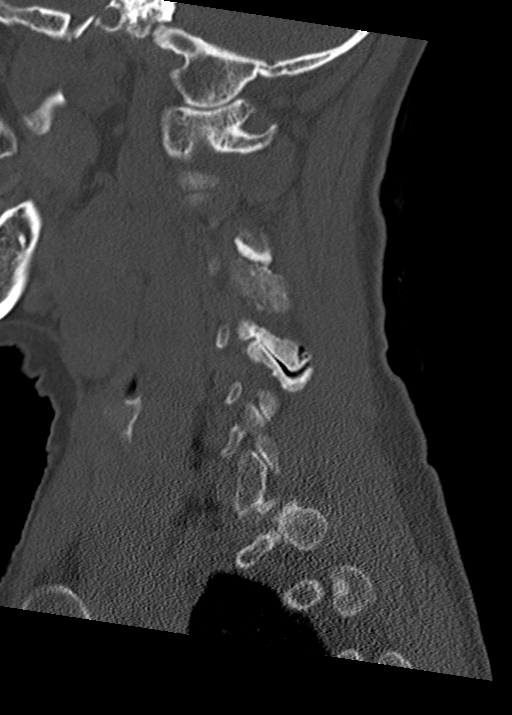
[im 28/56  bone]
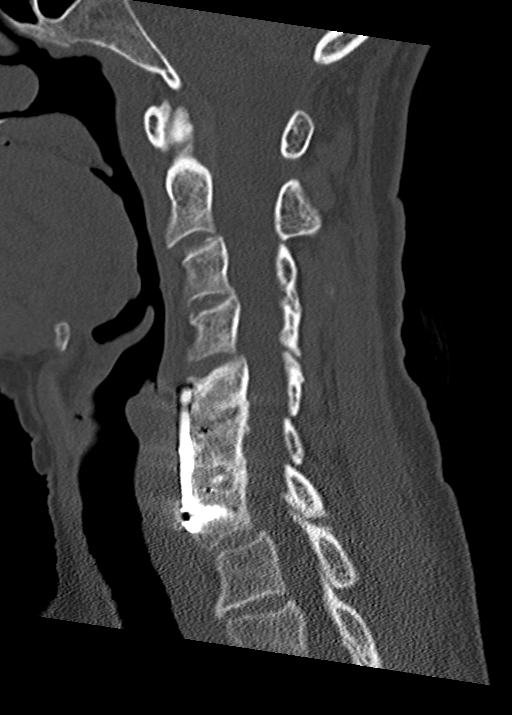
[im 37/56  bone]
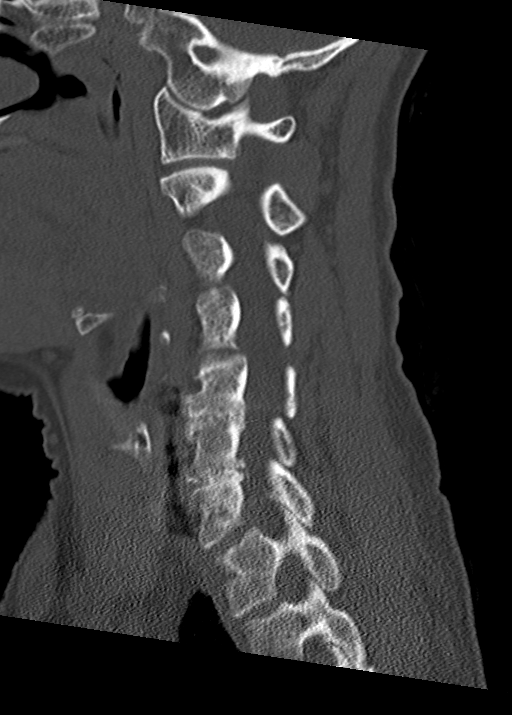
[im 46/56  bone]
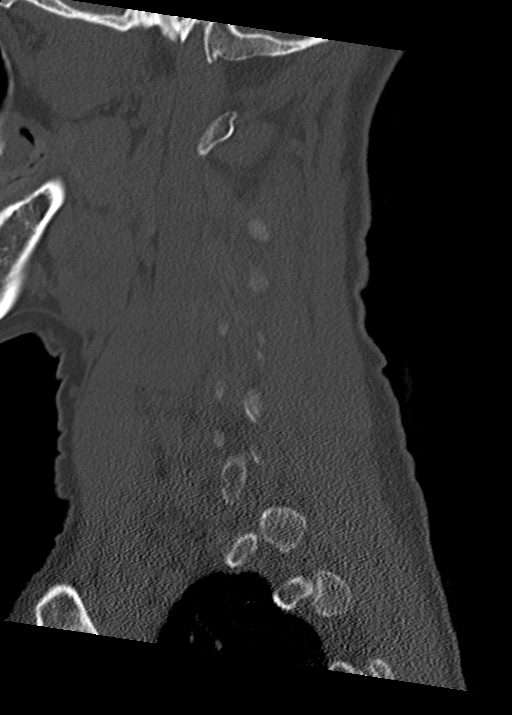

[Series 11: coronal bone · coronal · 0.22mm/px · 1 of 53 slices shown]
[im 27/53  bone]
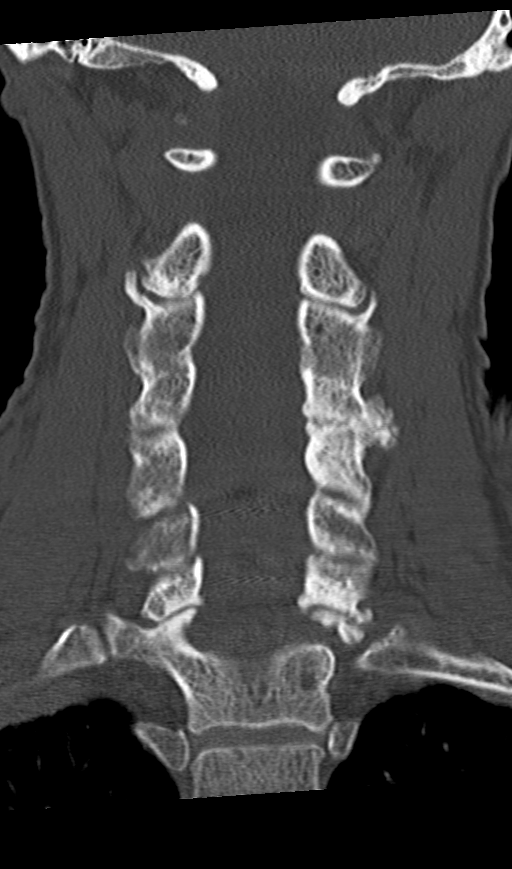

[Series 12: orthogonal bone · axial · 0.25mm/px · z∈[-302,-197]mm · 4 of 95 slices shown, 5 images]
[im 19/95  soft-tissue]
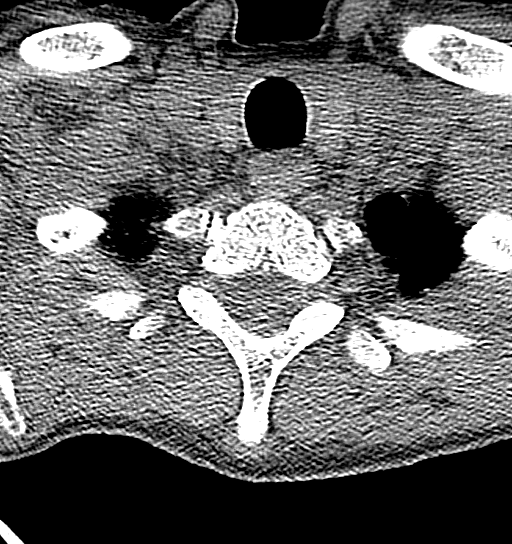
[im 19/95  bone]
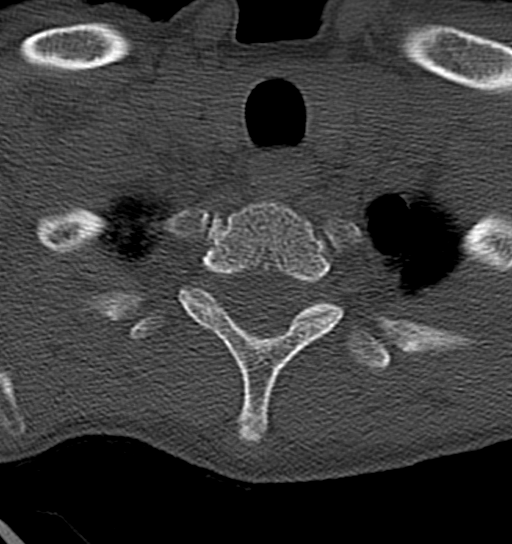
[im 38/95  bone]
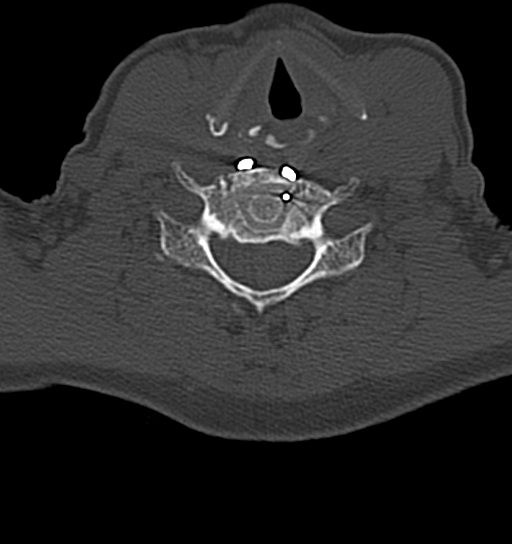
[im 57/95  bone]
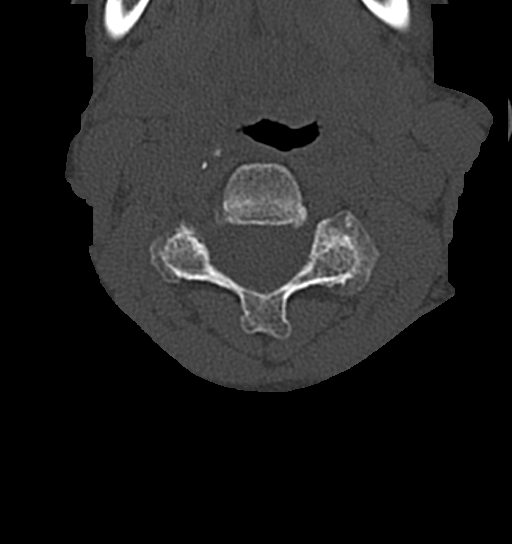
[im 76/95  bone]
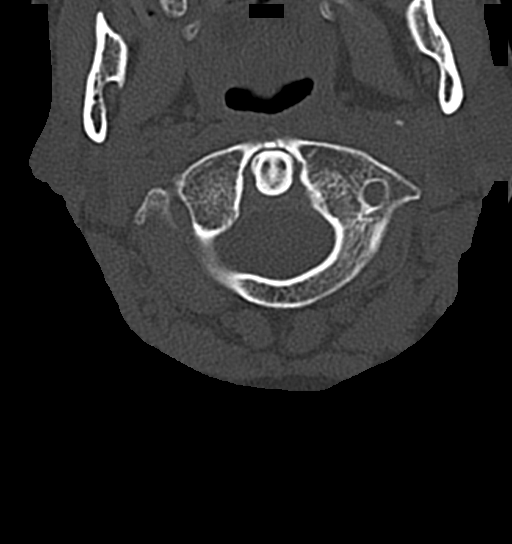

[14 of 33 positions shown; findings below may reference images not displayed]

FINDINGS: CT HEAD FINDINGS

Brain: Streak artifact from aneurysm coiling partially obscures
evaluation. Allowing for this, no evidence of acute hemorrhage. No
mass effect or midline shift. No hydrocephalus. No subdural or
extra-axial fluid collection.

Vascular: Basilar artery coiling. No gross hyperdense vessel,
however evaluation obscured by streak artifact.

Skull: Left parietal scalp laceration and contusion. No calvarial
fracture.

Sinuses/Orbits: No acute finding.

Other: None.

CT CERVICAL SPINE FINDINGS

Alignment: Straightening of normal lordosis. Minimal anterolisthesis
of C2 on C3, C3 on C4, and C7 on T1. No jumped or perched facets.

Skull base and vertebrae: No acute fracture. Anterior fusion C5
through C7 with interbody spacers. Vertebral body heights are
maintained.

Soft tissues and spinal canal: No prevertebral fluid or swelling. No
visible canal hematoma.

Disc levels: Mild disc space narrowing at C7-T1. Mild endplate
spurring at C4-C5. Scattered facet arthropathy with pseudoarthrosis
at C3-C4 bilaterally. Scattered left neural foraminal stenosis.

Upper chest: Minimal emphysematous change.  No acute abnormality.

Other: None.
IMPRESSION: 1. Left parietal up laceration and hematoma, no fracture. No
evidence of acute intracranial abnormality, streak artifact from
aneurysm coiling partially obscures evaluation.
2. Postsurgical and degenerative change in the cervical spine. No
acute fracture or subluxation.

## 2021-10-11 ENCOUNTER — Encounter: Payer: Self-pay | Admitting: Internal Medicine

## 2023-02-23 ENCOUNTER — Emergency Department: Payer: No Typology Code available for payment source

## 2023-02-23 ENCOUNTER — Other Ambulatory Visit: Payer: Self-pay

## 2023-02-23 DIAGNOSIS — M79602 Pain in left arm: Secondary | ICD-10-CM | POA: Insufficient documentation

## 2023-02-23 DIAGNOSIS — R519 Headache, unspecified: Secondary | ICD-10-CM | POA: Diagnosis not present

## 2023-02-23 DIAGNOSIS — Z87891 Personal history of nicotine dependence: Secondary | ICD-10-CM | POA: Diagnosis not present

## 2023-02-23 DIAGNOSIS — Z7982 Long term (current) use of aspirin: Secondary | ICD-10-CM | POA: Diagnosis not present

## 2023-02-23 LAB — BASIC METABOLIC PANEL
Anion gap: 9 (ref 5–15)
BUN: 20 mg/dL (ref 8–23)
CO2: 24 mmol/L (ref 22–32)
Calcium: 9.4 mg/dL (ref 8.9–10.3)
Chloride: 105 mmol/L (ref 98–111)
Creatinine, Ser: 0.84 mg/dL (ref 0.44–1.00)
GFR, Estimated: >60 mL/min (ref >60)
Glucose, Bld: 118 mg/dL — ABNORMAL HIGH (ref 70–99)
Potassium: 3.2 mmol/L — ABNORMAL LOW (ref 3.5–5.1)
Sodium: 138 mmol/L (ref 135–145)

## 2023-02-23 LAB — CBC
HCT: 45.2 % (ref 36.0–46.0)
Hemoglobin: 15.3 g/dL — ABNORMAL HIGH (ref 12.0–15.0)
MCH: 31.7 pg (ref 26.0–34.0)
MCHC: 33.8 g/dL (ref 30.0–36.0)
MCV: 93.6 fL (ref 80.0–100.0)
Platelets: 398 10*3/uL (ref 150–400)
RBC: 4.83 MIL/uL (ref 3.87–5.11)
RDW: 14.3 % (ref 11.5–15.5)
WBC: 10.3 10*3/uL (ref 4.0–10.5)
nRBC: 0 % (ref 0.0–0.2)

## 2023-02-23 LAB — TROPONIN I (HIGH SENSITIVITY): Troponin I (High Sensitivity): 3 ng/L (ref <18)

## 2023-02-23 NOTE — ED Triage Notes (Signed)
Pt c/o weakness, chest pain, and general malaise. Pt has pmh of aneurysms with coiling.  Pt also c/o a singular episode of "passing out" approx 3 weeks ago.

## 2023-02-24 ENCOUNTER — Emergency Department: Payer: No Typology Code available for payment source

## 2023-02-24 ENCOUNTER — Emergency Department
Admission: EM | Admit: 2023-02-24 | Discharge: 2023-02-24 | Disposition: A | Discharge status: Home / Self Care | Payer: No Typology Code available for payment source | Attending: Emergency Medicine | Admitting: Emergency Medicine

## 2023-02-24 DIAGNOSIS — M79602 Pain in left arm: Secondary | ICD-10-CM

## 2023-02-24 DIAGNOSIS — R519 Headache, unspecified: Secondary | ICD-10-CM

## 2023-02-24 LAB — TROPONIN I (HIGH SENSITIVITY): Troponin I (High Sensitivity): 3 ng/L (ref <18)

## 2023-02-24 MED ORDER — ACETAMINOPHEN 500 MG PO TABS
1000.0000 mg | ORAL_TABLET | Freq: Once | ORAL | Status: AC
  Administered 2023-02-24: 1000 mg via ORAL
  Filled 2023-02-24: qty 2

## 2023-02-24 NOTE — Discharge Instructions (Signed)
Your cardiac workup today was reassuring.  We have placed a referral for cardiology given your left arm tightness, intermittent sweating and nausea.  Your head CT also showed no acute abnormality.

## 2023-02-24 NOTE — ED Provider Notes (Signed)
[facility] Provider Note    None    (approximate)   History   Chest Pain   HPI  Gina Powell is a 62 y.o. female with history of subarachnoid hemorrhage from ruptured basilar tip aneurysm status post coiling, emphysema who presents to the emergency department with left arm tightness that started today.  No chest pain or chest discomfort.  No shortness of breath.  States that she has "broke out in a cold sweat" several times today.  She has had nausea without vomiting.  She also reports she has felt "shaky" and has had mild headache and blurry vision.  She was worried that her symptoms could be coming from "my heart or my head".  No numbness, tingling, focal weakness.   History provided by patient.    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 Kelby Aline)   Cervical radiculopathy 06/2012   MRI showing multilevel DDD, stenosis at C5/6, C6/7 with foraminal compromise and nerve root impingement (06/2012) s/p ACDF   Diverticulosis    Emphysema    ?biapical emphysema found on CTA neck, rpt CXR 06/2011 hyperexpanded lungs   History of chicken pox    History of tobacco abuse    History of transfusion of whole blood ~1978   Pulmonary nodule    6mm left lateral base, rpt CXR 06/2011 WNL   Rectal polyp 07/2011   x4   Ruptured ectopic pregnancy 1980's   x2   Unspecified thrombosed hemorrhoids     Past Surgical History:  Procedure Laterality Date   ANEURYSM COILING  2012   ruptured basilar aneurysm   ANTERIOR CERVICAL DECOMP/DISCECTOMY FUSION  2014   C5-7 for radiculopathy   COLONOSCOPY  07/2011   rectal polyps x5, int hemorrhoids, diverticulosis (Pyrtle)   HUMERUS FRACTURE SURGERY     R plate in humerus   SALPINGOOPHORECTOMY     one ovary and tube   TONSILLECTOMY AND ADENOIDECTOMY  1972   TUBAL LIGATION      MEDICATIONS:   Prior to Admission medications   Medication Sig Start Date End Date Taking? Authorizing Provider  aspirin EC 325 MG tablet Take 1 tablet (325 mg total) by mouth daily. 04/29/12   Eustaquio Boyden, MD  hydrOXYzine (VISTARIL) 25 MG capsule Take 1 capsule (25 mg total) by mouth 3 (three) times daily as needed. 01/23/15   Beers, Charmayne Sheer, PA-C  LORazepam (ATIVAN) 1 MG tablet Take 1 tablet (1 mg total) by mouth 2 (two) times daily as needed for anxiety. 12/19/12   Eustaquio Boyden, MD  predniSONE (DELTASONE) 10 MG tablet Take 6 tablets daily x 2 days then decrease by 1 tablet every 2 days. 6-6-5-5-4-4-3-3-2-2-1-1-.5-.5 01/23/15   Beers, Charmayne Sheer, PA-C  ranitidine (ZANTAC) 150 MG tablet Take 1 tablet (150 mg total) by mouth 2 (two) times daily. 01/23/15   Beers, Charmayne Sheer, PA-C  sertraline (ZOLOFT) 100 MG tablet TAKE ONE (1) TABLET BY MOUTH EVERY DAY 09/25/13   Eustaquio Boyden, MD    Physical Exam   Triage Vital Signs: ED Triage Vitals  Encounter Vitals Group     BP 02/23/23 1954 (!) 146/80     Systolic BP Percentile --      Diastolic BP Percentile --      Pulse Rate 02/23/23 1954 93     Resp 02/23/23 1954 17     Temp  02/23/23 1954 97.9 F (36.6 C)     Temp Source 02/23/23 1954 Oral     SpO2 02/23/23 1959 99 %     Weight --      Height --      Head Circumference --      Peak Flow --      Pain Score 02/23/23 1954 0     Pain Loc --      Pain Education --      Exclude from Growth Chart --     Most recent vital signs: Vitals:   02/24/23 0025 02/24/23 0207  BP: 127/76 116/79  Pulse: 73 62  Resp: 17 18  Temp: 98 F (36.7 C)   SpO2: 95% 98%    CONSTITUTIONAL: Alert, responds appropriately to questions. Well-appearing; well-nourished HEAD: Normocephalic, atraumatic EYES: Conjunctivae clear, pupils appear equal, sclera nonicteric ENT: normal nose; moist mucous membranes NECK: Supple, normal ROM CARD: RRR; S1 and S2 appreciated RESP: Normal chest excursion without splinting or  tachypnea; breath sounds clear and equal bilaterally; no wheezes, no rhonchi, no rales, no hypoxia or respiratory distress, speaking full sentences ABD/GI: Non-distended; soft, non-tender, no rebound, no guarding, no peritoneal signs BACK: The back appears normal EXT: Normal ROM in all joints; no deformity noted, no edema, no calf tenderness or calf swelling, 2+ left radial pulse, compartments in the left arm are soft, no joint effusions in the left arm, no sign of cellulitis SKIN: Normal color for age and race; warm; no rash on exposed skin NEURO: Moves all extremities equally, normal speech PSYCH: The patient's mood and manner are appropriate.   ED Results / Procedures / Treatments   LABS: (all labs ordered are listed, but only abnormal results are displayed) Labs Reviewed  BASIC METABOLIC PANEL - Abnormal; Notable for the following components:      Result Value   Potassium 3.2 (*)    Glucose, Bld 118 (*)    All other components within normal limits  CBC - Abnormal; Notable for the following components:   Hemoglobin 15.3 (*)    All other components within normal limits  TROPONIN I (HIGH SENSITIVITY)  TROPONIN I (HIGH SENSITIVITY)     EKG:  EKG Interpretation Date/Time:  Saturday February 23 2023 19:45:48 EST Ventricular Rate:  92 PR Interval:  130 QRS Duration:  70 QT Interval:  340 QTC Calculation: 420 R Axis:   86  Text Interpretation: Normal sinus rhythm Possible Left atrial enlargement Nonspecific ST abnormality Abnormal ECG Confirmed by Rochele Raring [phone]) on 02/24/2023 3:01:56 AM         RADIOLOGY: My personal review and interpretation of imaging: Chest x-ray clear.  Head CT unremarkable.  I have personally reviewed all radiology reports.   CT HEAD WO CONTRAST ( )  Result Date: 02/24/2023 CLINICAL DATA:  Headache EXAM: CT HEAD WITHOUT CONTRAST TECHNIQUE: Contiguous axial images were obtained from the base of the skull through the vertex without intravenous  contrast. RADIATION DOSE REDUCTION: This exam was performed according to the departmental dose-optimization program which includes automated exposure control, adjustment of the mA and/or kV according to patient size and/or use of iterative reconstruction technique. COMPARISON:  04/01/2016 FINDINGS: Brain: No evidence of acute infarction, hemorrhage, hydrocephalus, extra-axial collection or mass lesion/mass effect. Vascular: Prior basilar artery aneurysm coiling. Skull: Normal. Negative for fracture or focal lesion. Sinuses/Orbits: The visualized paranasal sinuses are essentially clear. The mastoid air cells are unopacified. Other: None. IMPRESSION: No acute intracranial abnormality. Prior basilar artery aneurysm coiling. Electronically Signed  By: Charline Bills M.D.   On: 02/24/2023 03:01   DG Chest 2 View  Result Date: 02/23/2023 CLINICAL DATA:  Chest pain EXAM: CHEST - 2 VIEW COMPARISON:  06/28/2011 FINDINGS: The heart size and mediastinal contours are within normal limits. Both lungs are clear. The visualized skeletal structures are unremarkable. IMPRESSION: No active cardiopulmonary disease. Electronically Signed   By: Charlett Nose M.D.   On: 02/23/2023 20:26     PROCEDURES:  Critical Care performed: No     .1-3 Lead EKG Interpretation  Performed by: Asuncion Tapscott, Layla Maw, DO Authorized by: Mallorey Odonell, Layla Maw, DO     Interpretation: normal     ECG rate:  62   ECG rate assessment: normal     Rhythm: sinus rhythm     Ectopy: none     Conduction: normal       IMPRESSION / MDM / ASSESSMENT AND PLAN / ED COURSE  I reviewed the triage vital signs and the nursing notes.    Patient here with left arm discomfort, occasional sweating, nausea, headache, blurry vision, feeling "shaky".  The patient is on the cardiac monitor to evaluate for evidence of arrhythmia and/or significant heart rate changes.   DIFFERENTIAL DIAGNOSIS (includes but not limited to):   ACS, PE, doubt dissection,  pneumonia, pneumothorax, CHF.  Doubt CVA, intracranial hemorrhage.   Patient's presentation is most consistent with acute presentation with potential threat to life or bodily function.   PLAN: Patient's labs show no leukocytosis.  Hemoglobin is slightly hemoconcentrated.  Normal electrolytes.  Troponin x 2 negative.  Chest x-ray reviewed and interpreted by myself and the radiologist and is unremarkable.  EKG shows no ischemic abnormality.  Patient is requesting head imaging because of her headache, blurry vision and feeling shaky.  Will give Tylenol and obtain head CT.  Low suspicion for ruptured aneurysm.  I do not feel she needs a CTA today.   MEDICATIONS GIVEN IN ED: Medications  acetaminophen (TYLENOL) tablet 1,000 mg (1,000 mg Oral Given 02/24/23 0225)     ED COURSE: Head CT reviewed and interpreted by myself and the radiologist and shows no acute abnormality.  Will discharge home with patient EP and cardiology follow-up.   At this time, I do not feel there is any life-threatening condition present. I reviewed all nursing notes, vitals, pertinent previous records.  All lab and urine results, EKGs, imaging ordered have been independently reviewed and interpreted by myself.  I reviewed all available radiology reports from any imaging ordered this visit.  Based on my assessment, I feel the patient is safe to be discharged home without further emergent workup and can continue workup as an outpatient as needed. Discussed all findings, treatment plan as well as usual and customary return precautions.  They verbalize understanding and are comfortable with this plan.  Outpatient follow-up has been provided as needed.  All questions have been answered.    CONSULTS:  none   OUTSIDE RECORDS REVIEWED: Reviewed last neurosurgery note in 2018.       FINAL CLINICAL IMPRESSION(S) / ED DIAGNOSES   Final diagnoses:  Left arm pain  Generalized headache     Rx / DC Orders   ED Discharge  Orders          Ordered    Ambulatory referral to Cardiology       Comments: If you have not heard from the Cardiology office within the next 72 hours please call [phone].   02/24/23 0306    Ambulatory  Referral to Primary Care (Establish Care)        02/24/23 0306             Note:  This document was prepared using Dragon voice recognition software and may include unintentional dictation errors.   Makoa Satz, Layla Maw, DO 02/24/23 [phone]
